# Patient Record
Sex: Male | Born: 1960 | Race: White | Hispanic: No | Marital: Single | Smoking: Current every day smoker
Health system: Southern US, Community
[De-identification: ages and names within clinical notes are randomized; demographics above are authoritative.]

## PROBLEM LIST (undated history)

## (undated) DIAGNOSIS — R51 Headache: Secondary | ICD-10-CM

## (undated) DIAGNOSIS — E559 Vitamin D deficiency, unspecified: Secondary | ICD-10-CM

## (undated) DIAGNOSIS — T754XXA Electrocution, initial encounter: Secondary | ICD-10-CM

## (undated) DIAGNOSIS — M5030 Other cervical disc degeneration, unspecified cervical region: Secondary | ICD-10-CM

## (undated) DIAGNOSIS — R197 Diarrhea, unspecified: Secondary | ICD-10-CM

## (undated) DIAGNOSIS — I639 Cerebral infarction, unspecified: Secondary | ICD-10-CM

## (undated) DIAGNOSIS — M1990 Unspecified osteoarthritis, unspecified site: Secondary | ICD-10-CM

## (undated) DIAGNOSIS — K759 Inflammatory liver disease, unspecified: Secondary | ICD-10-CM

## (undated) HISTORY — PX: KNEE SURGERY: SHX244

## (undated) HISTORY — DX: Electrocution, initial encounter: T75.4XXA

## (undated) HISTORY — DX: Unspecified osteoarthritis, unspecified site: M19.90

## (undated) HISTORY — DX: Cerebral infarction, unspecified: I63.9

## (undated) HISTORY — DX: Inflammatory liver disease, unspecified: K75.9

## (undated) HISTORY — DX: Vitamin D deficiency, unspecified: E55.9

## (undated) HISTORY — DX: Headache: R51

## (undated) HISTORY — DX: Diarrhea, unspecified: R19.7

## (undated) HISTORY — DX: Other cervical disc degeneration, unspecified cervical region: M50.30

---

## 1994-11-03 DIAGNOSIS — T754XXA Electrocution, initial encounter: Secondary | ICD-10-CM

## 1994-11-03 HISTORY — DX: Electrocution, initial encounter: T75.4XXA

## 1995-11-04 DIAGNOSIS — I639 Cerebral infarction, unspecified: Secondary | ICD-10-CM

## 1995-11-04 HISTORY — DX: Cerebral infarction, unspecified: I63.9

## 2010-03-11 HISTORY — PX: ESOPHAGOGASTRODUODENOSCOPY: SHX1529

## 2010-03-11 HISTORY — PX: COLONOSCOPY: SHX174

## 2010-05-20 ENCOUNTER — Ambulatory Visit (HOSPITAL_COMMUNITY): Admission: RE | Admit: 2010-05-20 | Discharge: 2010-05-20 | Payer: Self-pay

## 2010-05-20 HISTORY — PX: LIVER BIOPSY: SHX301

## 2010-11-19 ENCOUNTER — Ambulatory Visit
Admission: RE | Admit: 2010-11-19 | Discharge: 2010-11-19 | Payer: Self-pay | Source: Home / Self Care | Attending: Internal Medicine | Admitting: Internal Medicine

## 2010-11-24 ENCOUNTER — Encounter (INDEPENDENT_AMBULATORY_CARE_PROVIDER_SITE_OTHER): Payer: Self-pay | Admitting: Internal Medicine

## 2011-01-18 LAB — CBC
MCH: 33.9 pg (ref 26.0–34.0)
MCHC: 35.2 g/dL (ref 30.0–36.0)
MCV: 96.3 fL (ref 78.0–100.0)
Platelets: 174 10*3/uL (ref 150–400)
RBC: 4.65 MIL/uL (ref 4.22–5.81)
RDW: 13.5 % (ref 11.5–15.5)

## 2011-01-18 LAB — PROTIME-INR: Prothrombin Time: 12.5 seconds (ref 11.6–15.2)

## 2011-03-24 ENCOUNTER — Ambulatory Visit (INDEPENDENT_AMBULATORY_CARE_PROVIDER_SITE_OTHER): Payer: BC Managed Care – PPO | Admitting: Internal Medicine

## 2011-03-24 DIAGNOSIS — B182 Chronic viral hepatitis C: Secondary | ICD-10-CM

## 2011-03-24 DIAGNOSIS — R51 Headache: Secondary | ICD-10-CM

## 2011-04-26 LAB — CBC AND DIFFERENTIAL: Hemoglobin: 14.1 g/dL (ref 13.5–17.5)

## 2011-04-26 LAB — BASIC METABOLIC PANEL: Glucose: 85 mg/dL

## 2011-05-20 ENCOUNTER — Ambulatory Visit (INDEPENDENT_AMBULATORY_CARE_PROVIDER_SITE_OTHER): Payer: BC Managed Care – PPO | Admitting: Internal Medicine

## 2011-05-20 DIAGNOSIS — B182 Chronic viral hepatitis C: Secondary | ICD-10-CM

## 2011-05-28 ENCOUNTER — Other Ambulatory Visit (INDEPENDENT_AMBULATORY_CARE_PROVIDER_SITE_OTHER): Payer: Self-pay | Admitting: *Deleted

## 2011-05-28 MED ORDER — HYDROCODONE-ACETAMINOPHEN 5-500 MG PO TABS: ORAL_TABLET | ORAL | Status: DC

## 2011-05-29 NOTE — Telephone Encounter (Signed)
Terri Setzer,NP called this RX into the patient's pharmacy.

## 2011-06-09 ENCOUNTER — Encounter (INDEPENDENT_AMBULATORY_CARE_PROVIDER_SITE_OTHER): Payer: Self-pay

## 2011-07-08 ENCOUNTER — Ambulatory Visit (INDEPENDENT_AMBULATORY_CARE_PROVIDER_SITE_OTHER): Payer: BC Managed Care – PPO | Admitting: Internal Medicine

## 2011-07-09 ENCOUNTER — Telehealth (INDEPENDENT_AMBULATORY_CARE_PROVIDER_SITE_OTHER): Payer: Self-pay | Admitting: *Deleted

## 2011-07-09 ENCOUNTER — Other Ambulatory Visit (INDEPENDENT_AMBULATORY_CARE_PROVIDER_SITE_OTHER): Payer: Self-pay | Admitting: Internal Medicine

## 2011-07-09 DIAGNOSIS — D649 Anemia, unspecified: Secondary | ICD-10-CM

## 2011-07-09 DIAGNOSIS — B1920 Unspecified viral hepatitis C without hepatic coma: Secondary | ICD-10-CM

## 2011-07-09 NOTE — Telephone Encounter (Signed)
Letter sent to the patient as a reminder and the lab order was faxed to [facility]

## 2011-07-30 LAB — CBC WITH DIFFERENTIAL/PLATELET
Basophils Relative: 0 % (ref 0–1)
HCT: 51 % (ref 39.0–52.0)
Hemoglobin: 16.7 g/dL (ref 13.0–17.0)
Lymphocytes Relative: 15 % (ref 12–46)
MCHC: 32.7 g/dL (ref 30.0–36.0)
Neutro Abs: 6.1 10*3/uL (ref 1.7–7.7)
Neutrophils Relative %: 74 % (ref 43–77)
Platelets: 182 10*3/uL (ref 150–400)

## 2011-07-31 ENCOUNTER — Other Ambulatory Visit (INDEPENDENT_AMBULATORY_CARE_PROVIDER_SITE_OTHER): Payer: Self-pay | Admitting: Internal Medicine

## 2011-08-04 ENCOUNTER — Ambulatory Visit (INDEPENDENT_AMBULATORY_CARE_PROVIDER_SITE_OTHER): Payer: Self-pay | Admitting: Internal Medicine

## 2011-08-04 ENCOUNTER — Ambulatory Visit (INDEPENDENT_AMBULATORY_CARE_PROVIDER_SITE_OTHER): Payer: BC Managed Care – PPO | Admitting: Internal Medicine

## 2011-08-04 ENCOUNTER — Encounter (INDEPENDENT_AMBULATORY_CARE_PROVIDER_SITE_OTHER): Payer: Self-pay | Admitting: Internal Medicine

## 2011-08-04 VITALS — BP 120/80 | HR 74 | Temp 98.4°F | Resp 12 | Ht 69.0 in | Wt 178.0 lb

## 2011-08-04 DIAGNOSIS — B182 Chronic viral hepatitis C: Secondary | ICD-10-CM

## 2011-08-04 LAB — HEPATITIS C RNA QUANTITATIVE: HCV Quantitative: 826000 IU/mL — ABNORMAL HIGH (ref <43)

## 2011-08-04 NOTE — Patient Instructions (Signed)
Can return to work 

## 2011-08-04 NOTE — Progress Notes (Signed)
Presenting complaint; here for followup for hepatitis C. Subjective; Cody Collins 50 year old Caucasian male who has chronic hepatitis C genotype IIIa was begun on antiviral therapy on 01/17/2011. He responded to therapy his HCVRNA was undetectable in June 2012. Because of side effects he was placed on medical leave. He missed his office visit on 07/08/2011. He stopped his antiviral therapy over 4 weeks ago. He did call Ms. Todd in our office twice in this call was returned; he did not answer the phone; patient did not leave for message on her answering service that he was stopping treatment. He states he developed the URI and he was just not feeling well. He did talk with Dr. Dimas Aguas last week and he is taking Claritin and feeling better he is not having fever chills or productive cough. Current medications; Current Outpatient Prescriptions on File Prior to Visit  Medication Sig Dispense Refill  . acetaminophen (TYLENOL) 100 MG/ML solution Take by mouth every 4 (four) hours as needed.        . Albuterol Sulfate (PROAIR HFA IN) Inhale into the lungs.        Marland Kitchen HYDROcodone-acetaminophen (VICODIN) 5-500 MG per tablet Take 1 tablet by mouth twice daily as needed   60 tablet  0  . pantoprazole (PROTONIX) 40 MG tablet Take 40 mg by mouth daily.        . sertraline (ZOLOFT) 50 MG tablet Take 50 mg by mouth daily.         objective BP 120/80  Pulse 74  Temp(Src) 98.4 F (36.9 C) (Oral)  Resp 12  Ht 5\' 9"  (1.753 m)  Wt 178 lb (80.74 kg)  BMI 26.29 kg/m2 Conjunctiva is pink. Sclerae nonicteric.  Oropharyngeal mucosa is normal No neck masses or thyromegaly noted. Lungs are clear to auscultation. His abdomen is full but soft and nontender without hepatosplenomegaly or masses. He does not have LE edema. Lab data From 07/29/2011 TSH 1.381 WBC 8.3, hemoglobin 16.7, hematocrit 51.0, platelet count 182K HCVRNA is 490000 iu/ml. Assessment; Chronic hepatitis genotype IIIa; biopsy July 2011 revealed grade 2  activity or inflammation and stage 0 fibrosis; patient was treated with Pegasys and ribavirin he was HCVRNA negative at 12 weeks. He unfortunately stopped therapy on his own without our knowledge. I actually did not know until today. This this would explain why his HCV RNA is positive again. Patient did not have any fibrosis on his last liver biopsy and there for his long-term prognosis is very good she should refrain from drinking alcohol. Plan Office visit in one year. Patient was given note to return to work. Please note I went over results with Dr. Selinda Flavin who is patient's primary care physician

## 2011-08-07 ENCOUNTER — Ambulatory Visit (INDEPENDENT_AMBULATORY_CARE_PROVIDER_SITE_OTHER): Payer: BC Managed Care – PPO | Admitting: Internal Medicine

## 2011-10-15 ENCOUNTER — Encounter (INDEPENDENT_AMBULATORY_CARE_PROVIDER_SITE_OTHER): Payer: Self-pay | Admitting: *Deleted

## 2011-11-10 ENCOUNTER — Ambulatory Visit (INDEPENDENT_AMBULATORY_CARE_PROVIDER_SITE_OTHER): Payer: BC Managed Care – PPO | Admitting: Internal Medicine

## 2012-01-01 ENCOUNTER — Encounter (INDEPENDENT_AMBULATORY_CARE_PROVIDER_SITE_OTHER): Payer: Self-pay | Admitting: *Deleted

## 2012-02-02 ENCOUNTER — Encounter (INDEPENDENT_AMBULATORY_CARE_PROVIDER_SITE_OTHER): Payer: BC Managed Care – PPO | Admitting: Internal Medicine

## 2012-08-12 ENCOUNTER — Encounter (INDEPENDENT_AMBULATORY_CARE_PROVIDER_SITE_OTHER): Payer: Self-pay | Admitting: *Deleted

## 2012-08-26 ENCOUNTER — Ambulatory Visit (INDEPENDENT_AMBULATORY_CARE_PROVIDER_SITE_OTHER): Payer: BC Managed Care – PPO | Admitting: Internal Medicine

## 2015-07-19 ENCOUNTER — Encounter (INDEPENDENT_AMBULATORY_CARE_PROVIDER_SITE_OTHER): Payer: BLUE CROSS/BLUE SHIELD | Admitting: Diagnostic Neuroimaging

## 2015-07-19 ENCOUNTER — Ambulatory Visit (INDEPENDENT_AMBULATORY_CARE_PROVIDER_SITE_OTHER): Payer: BLUE CROSS/BLUE SHIELD | Admitting: Diagnostic Neuroimaging

## 2015-07-19 DIAGNOSIS — M79606 Pain in leg, unspecified: Secondary | ICD-10-CM | POA: Diagnosis not present

## 2015-07-19 DIAGNOSIS — Z0289 Encounter for other administrative examinations: Secondary | ICD-10-CM

## 2015-07-19 DIAGNOSIS — R200 Anesthesia of skin: Secondary | ICD-10-CM

## 2015-07-19 NOTE — Procedures (Signed)
   [facility]  NCS (NERVE CONDUCTION STUDY) WITH EMG (ELECTROMYOGRAPHY) REPORT   STUDY DATE: 07/19/15 PATIENT NAME: Cody Collins DOB: [birth year] MRN: [id number]  ORDERING CLINICIAN: Syliva Overman   TECHNOLOGIST: Gearldine Shown  ELECTROMYOGRAPHER: Glenford Bayley. Penumalli, MD  CLINICAL INFORMATION: 54 year old male with lower extremity numbness and pain. History of hepatitis C and chronic alcohol abuse.   FINDINGS: NERVE CONDUCTION STUDY: Bilateral peroneal and tibial motor responses are normal. Bilateral sural and peroneal sensory responses are normal.  NEEDLE ELECTROMYOGRAPHY: Needle examination of right lower extremity vastus medialis, tibials anterior, gastrocnemius is normal. Needle examination of bilateral L5-S1 paraspinal muscles are normal.   IMPRESSION:  This is a normal study. No diagnostic evidence of large fiber neuropathy or lumbar radiculopathy at this time. Given the clinical context, a small fiber neuropathy due to hepatitis C and chronic alcohol abuse may be considered. Small fiber neuropathies are not usually detected with routine nerve conduction studies.    INTERPRETING PHYSICIAN:  Suanne Marker, MD Certified in Neurology, Neurophysiology and Neuroimaging  [facility] [address], [address] [place], [place] [address] [phone]

## 2015-08-04 HISTORY — PX: CERVICAL DISC SURGERY: SHX588

## 2016-02-27 ENCOUNTER — Telehealth (HOSPITAL_COMMUNITY): Payer: Self-pay | Admitting: *Deleted

## 2016-04-14 ENCOUNTER — Telehealth (HOSPITAL_COMMUNITY): Payer: Self-pay | Admitting: *Deleted

## 2016-04-14 NOTE — Telephone Encounter (Signed)
returned phone call, no answer, left voice message. 

## 2016-04-15 ENCOUNTER — Encounter (HOSPITAL_COMMUNITY): Payer: Self-pay | Admitting: Psychiatry

## 2016-04-15 ENCOUNTER — Ambulatory Visit (INDEPENDENT_AMBULATORY_CARE_PROVIDER_SITE_OTHER): Payer: Medicaid Other | Admitting: Psychiatry

## 2016-04-15 VITALS — BP 127/92 | HR 96 | Ht 67.0 in | Wt 175.6 lb

## 2016-04-15 DIAGNOSIS — F332 Major depressive disorder, recurrent severe without psychotic features: Secondary | ICD-10-CM

## 2016-04-15 DIAGNOSIS — F329 Major depressive disorder, single episode, unspecified: Secondary | ICD-10-CM | POA: Insufficient documentation

## 2016-04-15 MED ORDER — TRAZODONE HCL 50 MG PO TABS: 50.0000 mg | ORAL_TABLET | Freq: Every day | ORAL | Status: DC

## 2016-04-15 MED ORDER — DULOXETINE HCL 60 MG PO CPEP: 60.0000 mg | ORAL_CAPSULE | Freq: Every day | ORAL | Status: DC

## 2016-04-15 NOTE — Progress Notes (Signed)
Psychiatric Initial Adult Assessment   Patient Identification: Cody Collins MRN:  [id number] Date of Evaluation:  04/15/2016 Referral Source: Dr. Dimas Aguas, [facility] Chief Complaint:   Chief Complaint    Depression; Anxiety; Establish Care     Visit Diagnosis:    ICD-9-CM ICD-10-CM   1. Severe episode of recurrent major depressive disorder, without psychotic features (HCC) 296.33 F33.2     History of Present Illness:  This patient is a 55 year old divorced white male who lives with his daughter her husband and their 56 children in [place]. 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  The patient was referred by Dr. Dimas Aguas for further treatment and assessment of depression and anxiety.  The patient states that he has had no previous mental health treatment per se. He is been depressed on and off for years. Much of this started a few years ago when he began treatment for hepatitis C and interferon made him feel sad and depressed. He's been primarily on Zoloft. Lately however he feels like his life is falling apart because of health issues. He states that he is always been "strong and able to work.". 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 He's had to quit work over the last year due to severe arthritis in his knees and legs. Last year he also had to have cervical disc surgery which is now failed and he is having severe numbness and pain down his left leg. His primary care physician tried to start him on Cymbalta but at the time he had no insurance and no way to pay for. He has run out of funds and has to stay with his daughter which is bothering him as well.  The patient has a long history of substance abuse. 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 He used it on and off for a number of years but stopped after he had his children. He has twins that are 5 and a daughter that is 61. His wife was using far more  than he was and he ended up gaining custody of the children when they were very young and he raised them. He had an affair with her babysitter later and she became pregnant and had a boy which she did not find out about until the child was 9 years old.  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 Unfortunately after that he began using methamphetamine and marijuana and alcohol again. He states that he was drinking fairly heavily until about 2 weeks ago but now he is down to 56 beers a day from 5 or 6 a day. He is no longer using any drugs.  Currently the patient states that his mood is somewhat depressed despite being on Zoloft 100 mg daily. He feels sad and overwhelmed. He can't sleep often due to pain. His energy is poor and he is irritable and grumpy. He is often nervous and anxious but denies panic attacks. He has been avoiding people. He's never been suicidal or homicidal. Sometimes he has obsessional repetitive thoughts. He laughs inappropriately all the time he states he can help himself as as it is his way of relieving stress  Associated Signs/Symptoms: Depression Symptoms:  depressed mood, anhedonia, insomnia, fatigue, feelings of worthlessness/guilt, anxiety, loss of energy/fatigue, disturbed sleep, (Hypo) Manic Symptoms:  Irritable Mood,  Anxiety Symptoms:  Excessive Worry,   Past Psychiatric History: None, other than treatment by primary care  Previous Psychotropic Medications: Yes   Substance Abuse History in the last 12 months:  Yes.    Consequences of Substance Abuse: Medical Consequences:  History of hepatitis C Legal Consequences:  History of one DUI  Past Medical History:  Past Medical History  Diagnosis Date  . Hepatitis HEP C  . Headache(784.0)   . Diarrhea   . Arthritis   . Degenerative disc disease, cervical   . Vitamin D deficiency   . Stroke  (cerebrum) [facility])     Past Surgical History  Procedure Laterality Date  . Liver biopsy  05/20/2010  . Esophagogastroduodenoscopy  03/11/2010  . Colonoscopy  03/11/2010  . Knee surgery    . Cervical disc surgery      Family Psychiatric History: The patient's father was a heavy drug user as well. The mother has a history of depression  Family History:  Family History  Problem Relation Age of Onset  . Ovarian cancer Sister   . Healthy Paternal Grandfather   . Ovarian cancer Sister   . Healthy Sister   . Pneumonia Brother   . Healthy Daughter   . Depression Mother   . Drug abuse Father     Social History:   Social History   Social History  . Marital Status: Single    Spouse Name: N/A  . Number of Children: N/A  . Years of Education: N/A   Social History Main Topics  . Smoking status: Current Every Day Smoker -- 1.00 packs/day    Types: Cigarettes  . Smokeless tobacco: Current User     Comment: Vapor  . Alcohol Use: Yes     Comment: 04-15-2016 per pt 2-3 beer per day  . Drug Use: No     Comment: 04-15-2016 per pt no. Per pt it's been years since using Meth   . Sexual Activity: Not Currently   Other Topics Concern  . None   Social History Narrative    Additional Social History: The patient grew up in [place] with his mother and 5 siblings. His father really was not part of the family. He never finished high school but left for [place] with his girlfriend at age 55. As noted above he used drugs heavily for a number of years. 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 He has been married once and divorced when his children were very young. He had another child with a babysitter whom he found out about years later. He is currently unemployed and applying for disability  Allergies:   Allergies  Allergen Reactions  . Ciprofloxacin Hcl     Per pt Throat Swells   . Codeine   . Vitamin C [Ascorbic Acid]     Patient states that this causes problems with his ears.     Metabolic Disorder Labs: No results found for: HGBA1C, MPG No results found for: PROLACTIN No results found for: CHOL, TRIG, HDL, CHOLHDL, VLDL, LDLCALC   Current Medications: Current Outpatient Prescriptions  Medication Sig Dispense Refill  . Albuterol Sulfate (PROAIR HFA IN) Inhale into the lungs.      . fluticasone (FLONASE) 50 MCG/ACT nasal spray INSTILL 2 SPRAYS IN NOSTRIL(S) EVERY MORNING  12  . gabapentin (NEURONTIN) 400 MG capsule Take 400 mg by mouth at bedtime.  5  . HYDROcodone-acetaminophen (VICODIN) 5-500 MG per tablet Take 1 tablet by mouth twice daily as needed  60 tablet 0  .  PROAIR HFA 108 (90 Base) MCG/ACT inhaler INHALE 2 PUFFS INTO LUNGS FOUR TIMES DAILY  12  . traMADol (ULTRAM) 50 MG tablet TAKE 1 TABLET AS NEEDED EVERY 6 HOURS  0  . acetaminophen (TYLENOL) 100 MG/ML solution Take by mouth every 4 (four) hours as needed. Reported on 04/15/2016    . DULoxetine (CYMBALTA) 60 MG capsule Take 1 capsule (60 mg total) by mouth daily. 30 capsule 2  . loratadine (CLARITIN) 10 MG tablet Take 10 mg by mouth daily. Reported on 04/15/2016    . pantoprazole (PROTONIX) 40 MG tablet Take 40 mg by mouth daily. Reported on 04/15/2016    . predniSONE (DELTASONE) 20 MG tablet Take 20 mg by mouth 2 (two) times daily. Reported on 04/15/2016  0  . traZODone (DESYREL) 50 MG tablet Take 1 tablet (50 mg total) by mouth at bedtime. 30 tablet 2   No current facility-administered medications for this visit.    Neurologic: Headache: Yes Seizure: No Paresthesias:Yes  Musculoskeletal: Strength & Muscle Tone: decreased Gait & Station: unsteady Patient leans: N/A  Psychiatric Specialty Exam: Review of Systems  Constitutional: Positive for malaise/fatigue.  HENT: Positive for congestion.   Genitourinary: Positive for frequency.  Musculoskeletal: Positive for back pain, joint pain and neck pain.  Neurological: Positive for tingling, sensory change and focal weakness.   Psychiatric/Behavioral: Positive for depression. The patient is nervous/anxious and has insomnia.     Blood pressure 127/92, pulse 96, height 5\' 7"  (1.702 m), weight 175 lb 9.6 oz (79.652 kg), SpO2 96 %.Body mass index is 27.5 kg/(m^2).  General Appearance: Casual and Fairly Groomed  Eye Contact:  Good  Speech:  Clear and Coherent  Volume:  Normal  Mood:  Anxious  Affect:  Inappropriate laughs inappropriately and  repeatedly   Thought Process:  Coherent  Orientation:  Full (Time, Place, and Person)  Thought Content:  Rumination  Suicidal Thoughts:  No  Homicidal Thoughts:  No  Memory:  Immediate;   Good Recent;   Fair Remote;   Fair  Judgement:  Fair  Insight:  Fair  Psychomotor Activity:  Restlessness  Concentration:  Concentration: Fair and Attention Span: Fair  Recall:  [employer]ervFair  Fund of Knowledge:Fair  Language: Good  Akathisia:  No  Handed:  Right  AIMS (if indicated):    Assets:  Communication Skills Desire for Improvement Resilience Social Support  ADL's:  Intact  Cognition: WNL  Sleep:  poor    Treatment Plan Summary: Medication management   This patient is a 55 year old white male with a long history of substance abuse. He states he is depressed because of his decline in physical function and loss of funds. Cymbalta would probably be a better choice given his chronic pain and depression. Therefore he will stop Zoloft and start Cymbalta 60 mg daily. Since he is not sleeping trazodone 2 mg at bedtime will be added. He will also start counseling here. He's been strongly advised to stop drinking. He'll return to see me in 4 weeks   Diannia RuderOSS, Lataisha Colan, MD 6/13/201711:40 AM

## 2016-04-24 ENCOUNTER — Telehealth (HOSPITAL_COMMUNITY): Payer: Self-pay | Admitting: *Deleted

## 2016-04-24 NOTE — Telephone Encounter (Signed)
left voice message, provider out of office 05/20/16.

## 2016-04-28 ENCOUNTER — Telehealth (HOSPITAL_COMMUNITY): Payer: Self-pay | Admitting: *Deleted

## 2016-04-28 NOTE — Telephone Encounter (Signed)
left voice message provider out of office 05/20/16.

## 2016-05-20 ENCOUNTER — Ambulatory Visit (HOSPITAL_COMMUNITY): Payer: Self-pay | Admitting: Psychology

## 2016-05-21 ENCOUNTER — Ambulatory Visit (INDEPENDENT_AMBULATORY_CARE_PROVIDER_SITE_OTHER): Payer: Medicaid Other | Admitting: Psychiatry

## 2016-05-21 ENCOUNTER — Encounter (HOSPITAL_COMMUNITY): Payer: Self-pay | Admitting: Psychiatry

## 2016-05-21 VITALS — BP 130/80 | Ht 69.0 in | Wt 178.0 lb

## 2016-05-21 DIAGNOSIS — F332 Major depressive disorder, recurrent severe without psychotic features: Secondary | ICD-10-CM | POA: Diagnosis not present

## 2016-05-21 MED ORDER — DULOXETINE HCL 60 MG PO CPEP: 60.0000 mg | ORAL_CAPSULE | Freq: Every day | ORAL | Status: DC

## 2016-05-21 MED ORDER — TRAZODONE HCL 50 MG PO TABS: 50.0000 mg | ORAL_TABLET | Freq: Every day | ORAL | Status: DC

## 2016-05-21 NOTE — Progress Notes (Signed)
Psychiatric Initial Adult Assessment   Patient Identification: Cody Collins MRN:  [id number] Date of Evaluation:  05/21/2016 Referral Source: Dr. Dimas Aguas, [facility] Chief Complaint:   Chief Complaint    Depression; Anxiety; Alcohol Problem; Follow-up     Visit Diagnosis:    ICD-9-CM ICD-10-CM   1. Severe episode of recurrent major depressive disorder, without psychotic features (HCC) 296.33 F33.2     History of Present Illness:  This patient is a 55 year old divorced white male who lives with his daughter her husband and their 2 children in [place]. 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  The patient was referred by Dr. Dimas Aguas for further treatment and assessment of depression and anxiety.  The patient states that he has had no previous mental health treatment per se. He is been depressed on and off for years. Much of this started a few years ago when he began treatment for hepatitis C and interferon made him feel sad and depressed. He's been primarily on Zoloft. Lately however he feels like his life is falling apart because of health issues. He states that he is always been "strong and able to work.". 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 He's had to quit work over the last year due to severe arthritis in his knees and legs. Last year he also had to have cervical disc surgery which is now failed and he is having severe numbness and pain down his left leg. His primary care physician tried to start him on Cymbalta but at the time he had no insurance and no way to pay for. He has run out of funds and has to stay with his daughter which is bothering him as well.  The patient has a long history of substance abuse. 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 He used it on and off for a number of years but stopped after he had his children. He has twins that are 83 and a daughter that is 81. His wife was  using far more than he was and he ended up gaining custody of the children when they were very young and he raised them. He had an affair with her babysitter later and she became pregnant and had a boy which she did not find out about until the child was 1 years old.  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 Unfortunately after that he began using methamphetamine and marijuana and alcohol again. He states that he was drinking fairly heavily until about 2 weeks ago but now he is down to 2-3 beers a day from 5 or 6 a day. He is no longer using any drugs.  Currently the patient states that his mood is somewhat depressed despite being on Zoloft 100 mg daily. He feels sad and overwhelmed. He can't sleep often due to pain. His energy is poor and he is irritable and grumpy. He is often nervous and anxious but denies panic attacks. He has been avoiding people. He's never been suicidal or homicidal. Sometimes he has obsessional repetitive thoughts. He laughs inappropriately all the time he states he can help himself as as it is his way of relieving stress  The patient returns after 4 weeks. He is now on Cymbalta 60 mg daily and trazodone 50 mg at bedtime his mood has improved.  He still laughs compulsively and also sometimes will reduce things over and over if he is having bad thoughts while he is doing them. He still waiting on his appointment with Dr. Shelva Majestic. He's cut his drinking way back and states he is down to about 40 ounces of beer a day and I think this is still too much. He's having conflicts with his son-in-law and the son-in-law drinks heavily as well. Overall however he thinks he is doing better and is in less pain. He's going to work hard on trying to cut down his drinking  Associated Signs/Symptoms: Depression Symptoms:  depressed mood, anhedonia, insomnia, fatigue, feelings of  worthlessness/guilt, anxiety, loss of energy/fatigue, disturbed sleep, (Hypo) Manic Symptoms:  Irritable Mood, Anxiety Symptoms:  Excessive Worry,   Past Psychiatric History: None, other than treatment by primary care  Previous Psychotropic Medications: Yes   Substance Abuse History in the last 12 months:  Yes.    Consequences of Substance Abuse: Medical Consequences:  History of hepatitis C Legal Consequences:  History of one DUI  Past Medical History:  Past Medical History  Diagnosis Date  . Hepatitis HEP C  . Headache(784.0)   . Diarrhea   . Arthritis   . Degenerative disc disease, cervical   . Vitamin D deficiency   . Stroke (cerebrum) [facility])     Past Surgical History  Procedure Laterality Date  . Liver biopsy  05/20/2010  . Esophagogastroduodenoscopy  03/11/2010  . Colonoscopy  03/11/2010  . Knee surgery    . Cervical disc surgery      Family Psychiatric History: The patient's father was a heavy drug user as well. The mother has a history of depression  Family History:  Family History  Problem Relation Age of Onset  . Ovarian cancer Sister   . Healthy Paternal Grandfather   . Ovarian cancer Sister   . Healthy Sister   . Pneumonia Brother   . Healthy Daughter   . Depression Mother   . Drug abuse Father     Social History:   Social History   Social History  . Marital Status: Single    Spouse Name: N/A  . Number of Children: N/A  . Years of Education: N/A   Social History Main Topics  . Smoking status: Current Every Day Smoker -- 1.00 packs/day    Types: Cigarettes  . Smokeless tobacco: Current User     Comment: Vapor  . Alcohol Use: Yes     Comment: 04-15-2016 per pt 2-3 beer per day  . Drug Use: No     Comment: 04-15-2016 per pt no. Per pt it's been years since using Meth   . Sexual Activity: Not Currently   Other Topics Concern  . None   Social History Narrative    Additional Social History: The patient grew up in [place] with his  mother and 5 siblings. His father really was not part of the family. He never finished high school but left for [place] with his girlfriend at age 10. As noted above he used drugs heavily for a number of years. 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 He has been married once and divorced when his children were very young. He had another child with a babysitter whom he found out about years later. He is currently unemployed and applying for disability  Allergies:   Allergies  Allergen Reactions  . Ciprofloxacin Hcl     Per pt Throat Swells   . Codeine   . Vitamin  C [Ascorbic Acid]     Patient states that this causes problems with his ears.    Metabolic Disorder Labs: No results found for: HGBA1C, MPG No results found for: PROLACTIN No results found for: CHOL, TRIG, HDL, CHOLHDL, VLDL, LDLCALC   Current Medications: Current Outpatient Prescriptions  Medication Sig Dispense Refill  . acetaminophen (TYLENOL) 100 MG/ML solution Take by mouth every 4 (four) hours as needed. Reported on 04/15/2016    . Albuterol Sulfate (PROAIR HFA IN) Inhale into the lungs.      . DULoxetine (CYMBALTA) 60 MG capsule Take 1 capsule (60 mg total) by mouth daily. 30 capsule 2  . fluticasone (FLONASE) 50 MCG/ACT nasal spray INSTILL 2 SPRAYS IN NOSTRIL(S) EVERY MORNING  12  . gabapentin (NEURONTIN) 400 MG capsule Take 400 mg by mouth at bedtime.  5  . HYDROcodone-acetaminophen (VICODIN) 5-500 MG per tablet Take 1 tablet by mouth twice daily as needed  60 tablet 0  . loratadine (CLARITIN) 10 MG tablet Take 10 mg by mouth daily. Reported on 04/15/2016    . pantoprazole (PROTONIX) 40 MG tablet Take 40 mg by mouth daily. Reported on 04/15/2016    . predniSONE (DELTASONE) 20 MG tablet Take 20 mg by mouth 2 (two) times daily. Reported on 04/15/2016  0  . PROAIR HFA 108 (90 Base) MCG/ACT inhaler INHALE 2 PUFFS INTO LUNGS FOUR TIMES DAILY  12  . traMADol (ULTRAM) 50 MG tablet TAKE 1 TABLET AS NEEDED EVERY 6 HOURS  0   . traZODone (DESYREL) 50 MG tablet Take 1 tablet (50 mg total) by mouth at bedtime. 30 tablet 2   No current facility-administered medications for this visit.    Neurologic: Headache: Yes Seizure: No Paresthesias:Yes  Musculoskeletal: Strength & Muscle Tone: decreased Gait & Station: unsteady Patient leans: N/A  Psychiatric Specialty Exam: Review of Systems  Constitutional: Positive for malaise/fatigue.  HENT: Positive for congestion.   Genitourinary: Positive for frequency.  Musculoskeletal: Positive for back pain, joint pain and neck pain.  Neurological: Positive for tingling, sensory change and focal weakness.  Psychiatric/Behavioral: Positive for depression. The patient is nervous/anxious and has insomnia.     Blood pressure 130/80, height 5\' 9"  (1.753 m), weight 178 lb (80.74 kg).Body mass index is 26.27 kg/(m^2).  General Appearance: Casual and Fairly Groomed  Eye Contact:  Good  Speech:  Clear and Coherent  Volume:  Normal  Mood:  Anxious but less depressed   Affect:  Inappropriate laughs inappropriately and  repeatedly   Thought Process:  Coherent  Orientation:  Full (Time, Place, and Person)  Thought Content:  Rumination  Suicidal Thoughts:  No  Homicidal Thoughts:  No  Memory:  Immediate;   Good Recent;   Fair Remote;   Fair  Judgement:  Fair  Insight:  Fair  Psychomotor Activity:  Restlessness  Concentration:  Concentration: Fair and Attention Span: Fair  Recall:  [employer]  Fund of Knowledge:Fair  Language: Good  Akathisia:  No  Handed:  Right  AIMS (if indicated):    Assets:  Communication Skills Desire for Improvement Resilience Social Support  ADL's:  Intact  Cognition: WNL  Sleep:  poor    Treatment Plan Summary: Medication management   Patient will continue Cymbalta 60 mg daily and trazodone 50 mg daily at bedtime. He will keep his appointment with Sudie BaileyJohn Rodenbaugh and return to see me in 2 months   Diannia RuderOSS, DEBORAH, MD 7/19/20172:06 PM

## 2016-06-04 ENCOUNTER — Ambulatory Visit (INDEPENDENT_AMBULATORY_CARE_PROVIDER_SITE_OTHER): Payer: Medicaid Other | Admitting: Psychology

## 2016-06-20 ENCOUNTER — Ambulatory Visit (HOSPITAL_COMMUNITY): Payer: Self-pay | Admitting: Psychology

## 2016-07-22 ENCOUNTER — Encounter (HOSPITAL_COMMUNITY): Payer: Self-pay | Admitting: Psychiatry

## 2016-07-22 ENCOUNTER — Encounter: Payer: Self-pay | Admitting: *Deleted

## 2016-07-22 ENCOUNTER — Ambulatory Visit (INDEPENDENT_AMBULATORY_CARE_PROVIDER_SITE_OTHER): Payer: Medicaid Other | Admitting: Psychiatry

## 2016-07-22 ENCOUNTER — Ambulatory Visit (INDEPENDENT_AMBULATORY_CARE_PROVIDER_SITE_OTHER): Payer: Medicaid Other | Admitting: Diagnostic Neuroimaging

## 2016-07-22 VITALS — BP 107/69 | HR 100 | Ht 67.0 in | Wt 177.0 lb

## 2016-07-22 VITALS — BP 137/87 | HR 75 | Ht 67.0 in | Wt 178.0 lb

## 2016-07-22 DIAGNOSIS — F332 Major depressive disorder, recurrent severe without psychotic features: Secondary | ICD-10-CM | POA: Diagnosis not present

## 2016-07-22 DIAGNOSIS — F329 Major depressive disorder, single episode, unspecified: Secondary | ICD-10-CM

## 2016-07-22 DIAGNOSIS — G3184 Mild cognitive impairment, so stated: Secondary | ICD-10-CM | POA: Diagnosis not present

## 2016-07-22 DIAGNOSIS — F1010 Alcohol abuse, uncomplicated: Secondary | ICD-10-CM | POA: Diagnosis not present

## 2016-07-22 DIAGNOSIS — F32A Depression, unspecified: Secondary | ICD-10-CM

## 2016-07-22 DIAGNOSIS — R413 Other amnesia: Secondary | ICD-10-CM | POA: Diagnosis not present

## 2016-07-22 MED ORDER — DULOXETINE HCL 60 MG PO CPEP: 60.0000 mg | ORAL_CAPSULE | Freq: Every day | ORAL | 2 refills | Status: DC

## 2016-07-22 MED ORDER — TRAZODONE HCL 50 MG PO TABS: 50.0000 mg | ORAL_TABLET | Freq: Every day | ORAL | 2 refills | Status: DC

## 2016-07-22 NOTE — Progress Notes (Signed)
Psychiatric Initial Adult Assessment   Patient Identification: Cody CongMichael E Glacken MRN:  [id number] Date of Evaluation:  07/22/2016 Referral Source: Dr. Dimas AguasHoward, [facility] Chief Complaint:   Chief Complaint    Depression; Anxiety; Follow-up     Visit Diagnosis:  No diagnosis found.  History of Present Illness:  This patient is a 55 year old divorced white male who lives with his daughter her husband and their 2 children [place]. 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  The patient was referred by Dr. Dimas AguasHoward for further treatment and assessment of depression and anxiety.  The patient states that he has had no previous mental health treatment per se. He is been depressed on and off for years. Much of this started a few years ago when he began treatment for hepatitis C and interferon made him feel sad and depressed. He's been primarily on Zoloft. Lately however he feels like his life is falling apart because of health issues. He states that he is always been "strong and able to work.". 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 He's had to quit work over the last year due to severe arthritis in his knees and legs. Last year he also had to have cervical disc surgery which is now failed and he is having severe numbness and pain down his left leg. His primary care physician tried to start him on Cymbalta but at the time he had no insurance and no way to pay for. He has run out of funds and has to stay with his daughter which is bothering him as well.  The patient has a long history of substance abuse. 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 He used it on and off for a number of years but stopped after he had his children. He has twins that are 6632 and a daughter that is 6030. His wife was using far more than he was and he ended up gaining custody of the children when they were very young and he raised them. He had an  affair with her babysitter later and she became pregnant and had a boy which she did not find out about until the child was 56 years old.  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 Unfortunately after that he began using methamphetamine and marijuana and alcohol again. He states that he was drinking fairly heavily until about 2 weeks ago but now he is down to 2-3 beers a day from 5 or 6 a day. He is no longer using any drugs.  Currently the patient states that his mood is somewhat depressed despite being on Zoloft 100 mg daily. He feels sad and overwhelmed. He can't sleep often due to pain. His energy is poor and he is irritable and grumpy. He is often nervous and anxious but denies panic attacks. He has been avoiding people. He's never been suicidal or homicidal. Sometimes he has obsessional repetitive thoughts. He laughs inappropriately all the time he states he can help himself as as it is his way of relieving stress  The patient returns after 2 months. For the most part he is doing okay but his living situation is not working out. He is very uncomfortable around his son-in-law who he claims doesn't like him and won't speak to him. He only goes to his daughter's  house and the son-in-law isn't there and sometimes sleeps in his car at friend's houses. He went out to [place] to visit his mother and had a great time out there and is thinking of moving either their [place] with his other daughters once he gets his disability. For his most part his mood has improved and he is sleeping well with the trazodone. He has cut down the beer drinking to one or 2 beers at bedtime  Associated Signs/Symptoms: Depression Symptoms:  depressed mood, anhedonia, insomnia, fatigue, feelings of worthlessness/guilt, anxiety, loss of energy/fatigue, disturbed sleep, (Hypo) Manic Symptoms:  Irritable Mood, Anxiety  Symptoms:  Excessive Worry,   Past Psychiatric History: None, other than treatment by primary care  Previous Psychotropic Medications: Yes   Substance Abuse History in the last 12 months:  Yes.    Consequences of Substance Abuse: Medical Consequences:  History of hepatitis C Legal Consequences:  History of one DUI  Past Medical History:  Past Medical History:  Diagnosis Date  . Arthritis   . Degenerative disc disease, cervical   . Diarrhea   . Electrocution 1996   from faulty wiring at pool  . Headache(784.0)   . Hepatitis HEP C   07/22/16 went through tx 3 times, clear at this time  . Stroke (cerebrum) (HCC) 1997  . Vitamin D deficiency     Past Surgical History:  Procedure Laterality Date  . CERVICAL DISC SURGERY  08/2015   fusion, pinched nerves  . COLONOSCOPY  03/11/2010  . ESOPHAGOGASTRODUODENOSCOPY  03/11/2010  . KNEE SURGERY Bilateral    2 surgeries on L, one on R- arthroscopic   . LIVER BIOPSY  05/20/2010    Family Psychiatric History: The patient's father was a heavy drug user as well. The mother has a history of depression  Family History:  Family History  Problem Relation Age of Onset  . Ovarian cancer Sister   . Ovarian cancer Sister   . Multiple sclerosis Sister   . Healthy Sister   . Pneumonia Brother   . Down syndrome Brother   . Healthy Daughter   . Depression Mother   . Parkinson's disease Mother   . Drug abuse Father   . Healthy Paternal Grandfather     Social History:   Social History   Social History  . Marital status: Single    Spouse name: N/A  . Number of children: 4  . Years of education: 26   Occupational History  .      disabled   Social History Main Topics  . Smoking status: Current Every Day Smoker    Packs/day: 1.00    Types: Cigarettes  . Smokeless tobacco: Never Used     Comment: Vapor, 07/22/16 smoking 10-15 cigs daily  . Alcohol use Yes     Comment: 04-15-2016 per pt 2-3 beer per day, 07/22/16 1-2  beers daily   . Drug use: No     Comment: 04-15-2016 per pt no. Per pt it's been years since using Meth   . Sexual activity: Not Currently   Other Topics Concern  . None   Social History Narrative   Lives with daughter    caffeine use- coffee- 3 cups daily    Additional Social History: The patient grew up in [place] with his mother and 5 siblings. His father really was not part of the family. He never finished high school but left for [place] with his girlfriend at age 21. As noted above he used drugs heavily for  a number of years. 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 He has been married once and divorced when his children were very young. He had another child with a babysitter whom he found out about years later. He is currently unemployed and applying for disability  Allergies:   Allergies  Allergen Reactions  . Ciprofloxacin Hcl     Per pt Throat Swells   . Codeine Itching  . Vitamin C [Ascorbic Acid]     Patient states that this causes problems with his ears.    Metabolic Disorder Labs: No results found for: HGBA1C, MPG No results found for: PROLACTIN No results found for: CHOL, TRIG, HDL, CHOLHDL, VLDL, LDLCALC   Current Medications: Current Outpatient Prescriptions  Medication Sig Dispense Refill  . acetaminophen (TYLENOL) 100 MG/ML solution Take by mouth every 4 (four) hours as needed. Reported on 04/15/2016    . Albuterol Sulfate (PROAIR HFA IN) Inhale into the lungs.      . cholecalciferol (VITAMIN D) 1000 units tablet Take 1,000 Units by mouth daily.    . DULoxetine (CYMBALTA) 60 MG capsule Take 1 capsule (60 mg total) by mouth daily. 30 capsule 2  . fluticasone (FLONASE) 50 MCG/ACT nasal spray INSTILL 2 SPRAYS IN NOSTRIL(S) EVERY MORNING  12  . gabapentin (NEURONTIN) 400 MG capsule Take 400 mg by mouth at bedtime.  5  . loratadine (CLARITIN) 10 MG tablet Take 10 mg by mouth daily. Reported on 04/15/2016    . pantoprazole (PROTONIX) 40 MG tablet Take 40 mg by mouth  daily. Reported on 04/15/2016    . predniSONE (DELTASONE) 20 MG tablet Take 20 mg by mouth 2 (two) times daily. Reported on 04/15/2016  0  . PROAIR HFA 108 (90 Base) MCG/ACT inhaler INHALE 2 PUFFS INTO LUNGS FOUR TIMES DAILY  12  . traMADol (ULTRAM) 50 MG tablet TAKE 1 TABLET AS NEEDED EVERY 6 HOURS  0  . traZODone (DESYREL) 50 MG tablet Take 1 tablet (50 mg total) by mouth at bedtime. 30 tablet 2   No current facility-administered medications for this visit.     Neurologic: Headache: Yes Seizure: No Paresthesias:Yes  Musculoskeletal: Strength & Muscle Tone: decreased Gait & Station: unsteady Patient leans: N/A  Psychiatric Specialty Exam: Review of Systems  Constitutional: Positive for malaise/fatigue.  HENT: Positive for congestion.   Genitourinary: Positive for frequency.  Musculoskeletal: Positive for back pain, joint pain and neck pain.  Neurological: Positive for tingling, sensory change and focal weakness.  Psychiatric/Behavioral: Positive for depression. The patient is nervous/anxious and has insomnia.     Blood pressure 107/69, pulse 100, height 5\' 7"  (1.702 m), weight 177 lb (80.3 kg).Body mass index is 27.72 kg/m.  General Appearance: Casual and Fairly Groomed  Eye Contact:  Good  Speech:  Clear and Coherent  Volume:  Normal   Mood: Slightly anxious, denies depressive symptoms   Affect:  Inappropriate laughs inappropriately and  repeatedly   Thought Process:  Coherent  Orientation:  Full (Time, Place, and Person)  Thought Content:  Rumination  Suicidal Thoughts:  No  Homicidal Thoughts:  No  Memory:  Immediate;   Good Recent;   Fair Remote;   Fair  Judgement:  Fair  Insight:  Fair  Psychomotor Activity:  Restlessness  Concentration:  Concentration: Fair and Attention Span: Fair  Recall:  [employer] of Knowledge:Fair  Language: Good  Akathisia:  No  Handed:  Right  AIMS (if indicated):    Assets:  Communication Skills Desire for  Improvement Resilience Social Support  ADL's:  Intact  Cognition: WNL  Sleep:  poor    Treatment Plan Summary: Medication management   Patient will continue Cymbalta 60 mg daily and trazodone 50 mg daily at bedtime. He will keep his appointment with Sudie Bailey and return to see me in 3 months   Diannia Ruder, MD 9/19/20172:52 PM

## 2016-07-22 NOTE — Progress Notes (Signed)
[facility]  PATIENT: Cody Collins DOB: October 29, 1961  REFERRING CLINICIAN: Syliva Overman HISTORY FROM: patient  REASON FOR VISIT: new consult    HISTORICAL  CHIEF COMPLAINT:  Chief Complaint  Patient presents with  . Memory Loss    rm 6, New Pt , MMSE 25    HISTORY OF PRESENT ILLNESS:   55 year old male here for evaluation of memory problems. For past 8 years he reports short-term memory problems, difficult to remember appointments, forgetting conversations, having difficulty with this pin number, forgetting combination locks. Patient lives with his daughter. Patient is disabled from July 2016 due to back problems. Otherwise he is able to take care of most of his day-to-day activities.  Patient has history of stroke at age 63 years old resulting in left face weakness and numbness. He had almost complete recovery.  Patient has history of hepatitis. Has history of excessive alcohol use but now has cut down.   REVIEW OF SYSTEMS: Full 14 system review of systems performed and negative with exception of: Only as per history of present illness.  ALLERGIES: Allergies  Allergen Reactions  . Ciprofloxacin Hcl     Per pt Throat Swells   . Codeine Itching  . Vitamin C [Ascorbic Acid]     Patient states that this causes problems with his ears.    HOME MEDICATIONS: Outpatient Medications Prior to Visit  Medication Sig Dispense Refill  . acetaminophen (TYLENOL) 100 MG/ML solution Take by mouth every 4 (four) hours as needed. Reported on 04/15/2016    . Albuterol Sulfate (PROAIR HFA IN) Inhale into the lungs.      . DULoxetine (CYMBALTA) 60 MG capsule Take 1 capsule (60 mg total) by mouth daily. 30 capsule 2  . fluticasone (FLONASE) 50 MCG/ACT nasal spray INSTILL 2 SPRAYS IN NOSTRIL(S) EVERY MORNING  12  . gabapentin (NEURONTIN) 400 MG capsule Take 400 mg by mouth at bedtime.  5  . HYDROcodone-acetaminophen (VICODIN) 5-500 MG per tablet Take 1 tablet by mouth twice  daily as needed  60 tablet 0  . loratadine (CLARITIN) 10 MG tablet Take 10 mg by mouth daily. Reported on 04/15/2016    . pantoprazole (PROTONIX) 40 MG tablet Take 40 mg by mouth daily. Reported on 04/15/2016    . predniSONE (DELTASONE) 20 MG tablet Take 20 mg by mouth 2 (two) times daily. Reported on 04/15/2016  0  . PROAIR HFA 108 (90 Base) MCG/ACT inhaler INHALE 2 PUFFS INTO LUNGS FOUR TIMES DAILY  12  . traMADol (ULTRAM) 50 MG tablet TAKE 1 TABLET AS NEEDED EVERY 6 HOURS  0  . traZODone (DESYREL) 50 MG tablet Take 1 tablet (50 mg total) by mouth at bedtime. 30 tablet 2   No facility-administered medications prior to visit.     PAST MEDICAL HISTORY: Past Medical History:  Diagnosis Date  . Arthritis   . Degenerative disc disease, cervical   . Diarrhea   . Electrocution 1996   from faulty wiring at pool  . Headache(784.0)   . Hepatitis HEP C   07/22/16 went through tx 3 times, clear at this time  . Stroke (cerebrum) (HCC) 1997  . Vitamin D deficiency     PAST SURGICAL HISTORY: Past Surgical History:  Procedure Laterality Date  . CERVICAL DISC SURGERY  08/2015   fusion, pinched nerves  . COLONOSCOPY  03/11/2010  . ESOPHAGOGASTRODUODENOSCOPY  03/11/2010  . KNEE SURGERY Bilateral    2 surgeries on L, one on R- arthroscopic   .  LIVER BIOPSY  05/20/2010    FAMILY HISTORY: Family History  Problem Relation Age of Onset  . Ovarian cancer Sister   . Ovarian cancer Sister   . Multiple sclerosis Sister   . Healthy Sister   . Pneumonia Brother   . Down syndrome Brother   . Healthy Daughter   . Depression Mother   . Parkinson's disease Mother   . Drug abuse Father   . Healthy Paternal Grandfather     SOCIAL HISTORY:  Social History   Social History  . Marital status: Single    Spouse name: N/A  . Number of children: 4  . Years of education: 40   Occupational History  .      disabled   Social History Main Topics  . Smoking status: Current Every Day Smoker     Packs/day: 1.00    Types: Cigarettes  . Smokeless tobacco: Never Used     Comment: Vapor, 07/22/16 smoking 10-15 cigs daily  . Alcohol use Yes     Comment: 04-15-2016 per pt 2-3 beer per day, 07/22/16 1-2  beers daily  . Drug use: No     Comment: 04-15-2016 per pt no. Per pt it's been years since using Meth   . Sexual activity: Not Currently   Other Topics Concern  . Not on file   Social History Narrative   Lives with daughter    caffeine use- coffee- 3 cups daily     PHYSICAL EXAM  GENERAL EXAM/CONSTITUTIONAL: Vitals:  Vitals:   07/22/16 0901  BP: 137/87  Pulse: 75  Weight: 178 lb (80.7 kg)  Height: 5\' 7"  (1.702 m)     Body mass index is 27.88 kg/m.  Visual Acuity Screening   Right eye Left eye Both eyes  Without correction:     With correction: 20/40 20/40      Patient is in no distress; well developed, nourished and groomed  NECK HAS DECR RANGE OF MOTION  CARDIOVASCULAR:  Examination of carotid arteries is normal; no carotid bruits  Regular rate and rhythm, no murmurs  Examination of peripheral vascular system by observation and palpation is normal  EYES:  Ophthalmoscopic exam of optic discs and posterior segments is normal; no papilledema or hemorrhages  MUSCULOSKELETAL:  Gait, strength, tone, movements noted in Neurologic exam below  NEUROLOGIC: MENTAL STATUS:  MMSE - Mini Mental State Exam 07/22/2016  Orientation to time 5  Orientation to Place 5  Registration 3  Attention/ Calculation 3  Recall 2  Language- name 2 objects 2  Language- repeat 1  Language- follow 3 step command 2  Language- follow 3 step command-comments "You don't want me to crease it, do you?"  Language- read & follow direction 1  Write a sentence 1  Copy design 0  Total score 25    awake, alert, oriented to person, place and time  recent and remote memory intact  normal attention and concentration  language fluent, comprehension intact, naming intact,   fund of  knowledge appropriate  EXCESSIVELY JOVIAL; INAPPROPRIATE LAUGHTER  CRANIAL NERVE:   2nd - no papilledema on fundoscopic exam  2nd, 3rd, 4th, 6th - pupils equal and reactive to light, visual fields full to confrontation, extraocular muscles intact, no nystagmus  5th - facial sensation symmetric  7th - facial strength symmetric  8th - hearing intact  9th - palate elevates symmetrically, uvula midline  11th - shoulder shrug symmetric  12th - tongue protrusion midline  MOTOR:   normal bulk and  tone, full strength in the BUE, BLE  SLOW MOTOR MOVEMENTS THROUGHOUT  SENSORY:   normal and symmetric to light touch, temperature, vibration  COORDINATION:   finger-nose-finger, fine finger movements normal  REFLEXES:   deep tendon reflexes TRACE and symmetric  GAIT/STATION:   narrow based gait; SLOW CAUTIOUS ANTALGIC GAIT; USES WOOD WALKING STICK    DIAGNOSTIC DATA (LABS, IMAGING, TESTING) - I reviewed patient records, labs, notes, testing and imaging myself where available.  Lab Results  Component Value Date   WBC 8.3 07/29/2011   HGB 16.7 07/29/2011   HCT 51.0 07/29/2011   MCV 101.6 (H) 07/29/2011   PLT 182 07/29/2011      Component Value Date/Time   NA 140 04/26/2011   K 4.5 04/26/2011   No results found for: CHOL, HDL, LDLCALC, LDLDIRECT, TRIG, CHOLHDL No results found for: ZOXW9UHGBA1C No results found for: VITAMINB12 Lab Results  Component Value Date   TSH 1.381 07/29/2011    02/04/13 CT head [I reviewed images myself and agree with interpretation. -VRP]  1. Slight progression of multilevel spondylosis in the cervical spine. 2. Mild central and moderate bilateral foraminal narrowing at C3-4. 3. Similar appearance of moderate central and bilateral foraminal narrowing at C4-5. 4. Slight progression of moderate central canal narrowing at C5-6. 5. Moderate foraminal narrowing bilaterally at C5-6. 6. Mild to moderate central and moderate foraminal narrowing  bilaterally at C6-7. 7. Moderate left and mild right foraminal stenosis at C7-T1 due to asymmetric facet hypertrophy.  06/13/15 MRI cervical spine [I reviewed images myself and agree with interpretation. -VRP]  1. Slight progression of multilevel spondylosis in the cervical spine. 2. Mild central and moderate bilateral foraminal narrowing at C3-4. 3. Similar appearance of moderate central and bilateral foraminal narrowing at C4-5. 4. Slight progression of moderate central canal narrowing at C5-6. 5. Moderate foraminal narrowing bilaterally at C5-6. 6. Mild to moderate central and moderate foraminal narrowing bilaterally at C6-7. 7. Moderate left and mild right foraminal stenosis at C7-T1 due to asymmetric facet hypertrophy.  06/13/15 MRI lumbar [I reviewed images myself and agree with interpretation. -VRP]  1. Disc degeneration at L3-L4 and L4-L5. At the latter there is right foraminal disc resulting in moderate foraminal stenosis. Query right L4 radiculitis.  2. No lumbar or lower thoracic spinal stenosis. Mild to moderate lower thoracic neural foraminal stenosis due to combined disc and facet degeneration. 3. Mild increased T2 signal in the visualized lower thoracic spinal cord, favored to be inconsequential (such as benign prominence of the central spinal canal or ventriculus terminalis variant). But if there are symptoms referable to the thoracic spinal cord, recommend followup thoracic spine MRI (without and with contrast preferred).  07/19/15 EMG/NCS - This is a normal study. No diagnostic evidence of large fiber neuropathy or lumbar radiculopathy at this time. Given the clinical context, a small fiber neuropathy due to hepatitis C and chronic alcohol abuse may be considered. Small fiber neuropathies are not usually detected with routine nerve conduction studies.      ASSESSMENT AND PLAN  55 y.o. year old male here with progressive short-term memory problems, forgetting appointments,  forgetting conversations, repeating himself, forgetting pin numbers and combination locks. May represent mild cognitive impairment, mild dementia, or other problem. Will proceed with further workup.   Ddx: memory loss (due to depression, pain, insomnia, chronic alcohol abuse) vs dementia   1. Memory loss   2. MCI (mild cognitive impairment) with memory loss   3. Depression   4. Chronic alcohol abuse  PLAN: - check MRI brain and labs - continue psychiatry/psychology treatments for depression - continue to cut down etoh use  Orders Placed This Encounter  Procedures  . MR Brain Wo Contrast  . Dementia Panel   Return in about 3 months (around 10/21/2016).  I reviewed images, labs, notes, records myself. I summarized findings and reviewed with patient, for this high risk condition (memory loss) requiring high complexity decision making.    Suanne Marker, MD 07/22/2016, 9:40 AM Certified in Neurology, Neurophysiology and Neuroimaging  [facility] [address], [address] [place], [place] [address] [phone]

## 2016-07-22 NOTE — Patient Instructions (Signed)
Thank you for coming to see [place] at [facility]. I hope we have been able to provide you high quality care today.  You may receive a patient satisfaction survey over the next few weeks. We would appreciate your feedback and comments so that we may continue to improve ourselves and the health of our patients.  - I will check MRI brain and lab testing - stay active (mentally and physically) - reduce cigarette and alcohol use   ~~~~~~~~~~~~~~~~~~~~~~~~~~~~~~~~~~~~~~~~~~~~~~~~~~~~~~~~~~~~~~~~~  DR. Saladin Petrelli'S GUIDE TO HAPPY AND HEALTHY LIVING These are some of my general health and wellness recommendations. Some of them may apply to you better than others. Please use common sense as you try these suggestions and feel free to ask me any questions.   ACTIVITY/FITNESS Mental, social, emotional and physical stimulation are very important for brain and body health. Try learning a new activity (arts, music, language, sports, games).  Keep moving your body to the best of your abilities. You can do this at home, inside or outside, the park, community center, gym or anywhere you like. Consider a physical therapist or personal trainer to get started. Consider the app Sworkit. Fitness trackers such as smart-watches, smart-phones or Fitbits can help as well.   NUTRITION Eat more plants: colorful vegetables, nuts, seeds and berries.  Eat less sugar, salt, preservatives and processed foods.  Avoid toxins such as cigarettes and alcohol.  Drink water when you are thirsty. Warm water with a slice of lemon is an excellent morning drink to start the day.  Consider these websites for more information The Nutrition Source (https://www.henry-hernandez.biz/) Precision Nutrition (WindowBlog.ch)   RELAXATION Consider practicing mindfulness meditation or other relaxation techniques such as deep breathing, prayer, yoga, tai chi, massage. See website  mindful.org or the apps Headspace or Calm to help get started.   SLEEP Try to get at least 7-8+ hours sleep per day. Regular exercise and reduced caffeine will help you sleep better. Practice good sleep hygeine techniques. See website sleep.org for more information.   PLANNING Prepare estate planning, living will, healthcare POA documents. Sometimes this is best planned with the help of an attorney. Theconversationproject.org and agingwithdignity.org are excellent resources.

## 2016-07-23 LAB — DEMENTIA PANEL
Homocysteine: 12.4 umol/L (ref 0.0–15.0)
RPR: NONREACTIVE
TSH: 2.37 u[IU]/mL (ref 0.450–4.500)
Vitamin B-12: 336 pg/mL (ref 211–946)

## 2016-07-24 ENCOUNTER — Telehealth: Payer: Self-pay | Admitting: *Deleted

## 2016-07-24 NOTE — Telephone Encounter (Signed)
-----   Message from Vikram R Penumalli, MD sent at 07/24/2016  2:09 PM EDT ----- Unremarkable labs. Continue current plan. Please call patient. -VRP 

## 2016-07-24 NOTE — Telephone Encounter (Signed)
Called and LVM for pt about unremarkable results per Dr Marjory LiesPenumalli. Gave [facility] phone number if he has further questions or concerns.

## 2016-08-07 ENCOUNTER — Ambulatory Visit (INDEPENDENT_AMBULATORY_CARE_PROVIDER_SITE_OTHER): Payer: Medicaid Other | Admitting: Psychology

## 2016-08-14 ENCOUNTER — Ambulatory Visit
Admission: RE | Admit: 2016-08-14 | Discharge: 2016-08-14 | Disposition: A | Discharge status: Home / Self Care | Payer: Medicaid Other | Source: Ambulatory Visit | Attending: Diagnostic Neuroimaging | Admitting: Diagnostic Neuroimaging

## 2016-08-14 ENCOUNTER — Inpatient Hospital Stay: Admission: RE | Admit: 2016-08-14 | Payer: Self-pay | Source: Ambulatory Visit

## 2016-08-14 DIAGNOSIS — R413 Other amnesia: Secondary | ICD-10-CM

## 2016-08-14 DIAGNOSIS — G3184 Mild cognitive impairment, so stated: Secondary | ICD-10-CM

## 2016-08-14 DIAGNOSIS — F32A Depression, unspecified: Secondary | ICD-10-CM

## 2016-08-14 DIAGNOSIS — F329 Major depressive disorder, single episode, unspecified: Secondary | ICD-10-CM

## 2016-08-14 DIAGNOSIS — F1010 Alcohol abuse, uncomplicated: Secondary | ICD-10-CM

## 2016-08-18 ENCOUNTER — Telehealth: Payer: Self-pay | Admitting: *Deleted

## 2016-08-18 NOTE — Telephone Encounter (Signed)
Per Dr Marjory LiesPenumalli, LVM for patient informing him his MRI result showed no major changes from previous MRI. Dr Marjory LiesPenumalli will continue with his current treatment plan; advised Dr Marjory LiesPenumalli recommended he cut down on alcohol use and continue with psychiatry for depression. Left name, number for any questions.

## 2016-08-28 ENCOUNTER — Ambulatory Visit (INDEPENDENT_AMBULATORY_CARE_PROVIDER_SITE_OTHER): Payer: Medicaid Other | Admitting: Psychology

## 2016-09-24 ENCOUNTER — Ambulatory Visit (INDEPENDENT_AMBULATORY_CARE_PROVIDER_SITE_OTHER): Payer: Medicaid Other | Admitting: Psychology

## 2016-10-08 ENCOUNTER — Ambulatory Visit (INDEPENDENT_AMBULATORY_CARE_PROVIDER_SITE_OTHER): Payer: Medicaid Other | Admitting: Psychology

## 2016-10-08 DIAGNOSIS — F332 Major depressive disorder, recurrent severe without psychotic features: Secondary | ICD-10-CM | POA: Diagnosis not present

## 2016-10-10 ENCOUNTER — Other Ambulatory Visit (HOSPITAL_COMMUNITY): Payer: Self-pay | Admitting: Psychiatry

## 2016-10-13 ENCOUNTER — Telehealth (HOSPITAL_COMMUNITY): Payer: Self-pay | Admitting: *Deleted

## 2016-10-13 NOTE — Telephone Encounter (Signed)
Called pt number on file and no response. [email or web address]

## 2016-10-13 NOTE — Telephone Encounter (Signed)
Called pt and informed him that his pharmacy was requesting refills for his medications and per his chart he should be out of meds the day of his appt. Pt verbalized showed understanding and verified appt for 10-21-2016

## 2016-10-13 NOTE — Telephone Encounter (Signed)
patient returned phone call to [facility] at 10:12 a.m.

## 2016-10-21 ENCOUNTER — Ambulatory Visit (INDEPENDENT_AMBULATORY_CARE_PROVIDER_SITE_OTHER): Payer: Medicaid Other | Admitting: Psychiatry

## 2016-10-21 ENCOUNTER — Encounter (HOSPITAL_COMMUNITY): Payer: Self-pay | Admitting: Psychiatry

## 2016-10-21 VITALS — BP 131/92 | HR 85 | Ht 67.0 in | Wt 181.6 lb

## 2016-10-21 DIAGNOSIS — Z813 Family history of other psychoactive substance abuse and dependence: Secondary | ICD-10-CM

## 2016-10-21 DIAGNOSIS — Z888 Allergy status to other drugs, medicaments and biological substances status: Secondary | ICD-10-CM

## 2016-10-21 DIAGNOSIS — F17210 Nicotine dependence, cigarettes, uncomplicated: Secondary | ICD-10-CM

## 2016-10-21 DIAGNOSIS — Z8041 Family history of malignant neoplasm of ovary: Secondary | ICD-10-CM | POA: Diagnosis not present

## 2016-10-21 DIAGNOSIS — Z79899 Other long term (current) drug therapy: Secondary | ICD-10-CM

## 2016-10-21 DIAGNOSIS — F332 Major depressive disorder, recurrent severe without psychotic features: Secondary | ICD-10-CM

## 2016-10-21 DIAGNOSIS — Z818 Family history of other mental and behavioral disorders: Secondary | ICD-10-CM | POA: Diagnosis not present

## 2016-10-21 DIAGNOSIS — Z8489 Family history of other specified conditions: Secondary | ICD-10-CM | POA: Diagnosis not present

## 2016-10-21 MED ORDER — DULOXETINE HCL 60 MG PO CPEP: 60.0000 mg | ORAL_CAPSULE | Freq: Two times a day (BID) | ORAL | 2 refills | Status: DC

## 2016-10-21 MED ORDER — TRAZODONE HCL 50 MG PO TABS: 50.0000 mg | ORAL_TABLET | Freq: Every day | ORAL | 2 refills | Status: DC

## 2016-10-21 NOTE — Progress Notes (Signed)
Psychiatric Initial Adult Assessment   Patient Identification: Cody Collins MRN:  [id number] Date of Evaluation:  10/21/2016 Referral Source: Dr. Dimas Aguas, [facility] Chief Complaint:   Chief Complaint    Depression; Anxiety; Follow-up     Visit Diagnosis:    ICD-9-CM ICD-10-CM   1. Severe episode of recurrent major depressive disorder, without psychotic features (HCC) 296.33 F33.2     History of Present Illness:  This patient is a 55 year old divorced white male who lives with his daughter her husband and their 2 children in [place]. 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  The patient was referred by Dr. Dimas Aguas for further treatment and assessment of depression and anxiety.  The patient states that he has had no previous mental health treatment per se. He is been depressed on and off for years. Much of this started a few years ago when he began treatment for hepatitis C and interferon made him feel sad and depressed. He's been primarily on Zoloft. Lately however he feels like his life is falling apart because of health issues. He states that he is always been "strong and able to work.". 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 He's had to quit work over the last year due to severe arthritis in his knees and legs. Last year he also had to have cervical disc surgery which is now failed and he is having severe numbness and pain down his left leg. His primary care physician tried to start him on Cymbalta but at the time he had no insurance and no way to pay for. He has run out of funds and has to stay with his daughter which is bothering him as well.  The patient has a long history of substance abuse. 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 He used it on and off for a number of years but stopped after he had his children. He has twins that are 8 and a daughter that is 19. His wife was using far more  than he was and he ended up gaining custody of the children when they were very young and he raised them. He had an affair with her babysitter later and she became pregnant and had a boy which she did not find out about until the child was 72 years old.  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 Unfortunately after that he began using methamphetamine and marijuana and alcohol again. He states that he was drinking fairly heavily until about 2 weeks ago but now he is down to 2-3 beers a day from 5 or 6 a day. He is no longer using any drugs.  Currently the patient states that his mood is somewhat depressed despite being on Zoloft 100 mg daily. He feels sad and overwhelmed. He can't sleep often due to pain. His energy is poor and he is irritable and grumpy. He is often nervous and anxious but denies panic attacks. He has been avoiding people. He's never been suicidal or homicidal. Sometimes he has obsessional repetitive thoughts. He laughs inappropriately all the time he states he can help himself as as it is his way of relieving stress  The patient returns after 2 months. For the most part he is doing okay but his living situation is still difficult. He and his son-in-law are  not speaking. He states that he is been thinking a lot and he has the same thoughts over and over again. He is very worried about financial issues and probably won't have a disability hearing until next summer. He currently has no income. I suggested we increase the Cymbalta for depression and anxiety and he is in agreement. To his credit he has totally quit drinking. His sleep is okay with the trazodone  Associated Signs/Symptoms: Depression Symptoms:  depressed mood, anhedonia, insomnia, fatigue, feelings of worthlessness/guilt, anxiety, loss of energy/fatigue, disturbed sleep, (Hypo) Manic Symptoms:  Irritable  Mood, Anxiety Symptoms:  Excessive Worry,   Past Psychiatric History: None, other than treatment by primary care  Previous Psychotropic Medications: Yes   Substance Abuse History in the last 12 months:  Yes.    Consequences of Substance Abuse: Medical Consequences:  History of hepatitis C Legal Consequences:  History of one DUI  Past Medical History:  Past Medical History:  Diagnosis Date  . Arthritis   . Degenerative disc disease, cervical   . Diarrhea   . Electrocution 1996   from faulty wiring at pool  . Headache(784.0)   . Hepatitis HEP C   07/22/16 went through tx 3 times, clear at this time  . Stroke (cerebrum) (HCC) 1997  . Vitamin D deficiency     Past Surgical History:  Procedure Laterality Date  . CERVICAL DISC SURGERY  08/2015   fusion, pinched nerves  . COLONOSCOPY  03/11/2010  . ESOPHAGOGASTRODUODENOSCOPY  03/11/2010  . KNEE SURGERY Bilateral    2 surgeries on L, one on R- arthroscopic   . LIVER BIOPSY  05/20/2010    Family Psychiatric History: The patient's father was a heavy drug user as well. The mother has a history of depression  Family History:  Family History  Problem Relation Age of Onset  . Ovarian cancer Sister   . Ovarian cancer Sister   . Multiple sclerosis Sister   . Healthy Sister   . Pneumonia Brother   . Down syndrome Brother   . Healthy Daughter   . Depression Mother   . Parkinson's disease Mother   . Drug abuse Father   . Healthy Paternal Grandfather     Social History:   Social History   Social History  . Marital status: Single    Spouse name: N/A  . Number of children: 4  . Years of education: 4011   Occupational History  .      disabled   Social History Main Topics  . Smoking status: Current Every Day Smoker    Packs/day: 1.00    Types: Cigarettes  . Smokeless tobacco: Never Used     Comment: Vapor, 07/22/16 smoking 10-15 cigs daily  . Alcohol use Yes     Comment: 04-15-2016 per pt 2-3 beer per day, 07/22/16 1-2   beers daily  . Drug use: No     Comment: 04-15-2016 per pt no. Per pt it's been years since using Meth   . Sexual activity: Not Currently   Other Topics Concern  . None   Social History Narrative   Lives with daughter    caffeine use- coffee- 3 cups daily    Additional Social History: The patient grew up [place] with his mother and 5 siblings. His father really was not part of the family. He never finished high school but left for [place] with his girlfriend at age 55. As noted above he used drugs heavily for a number of years. 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 He has been married once and divorced when his children were very young. He had another child with a babysitter whom he found out about years later. He is currently unemployed and applying for disability  Allergies:   Allergies  Allergen Reactions  . Ciprofloxacin Hcl     Per pt Throat Swells   . Codeine Itching  . Vitamin C [Ascorbic Acid]     Patient states that this causes problems with his ears.    Metabolic Disorder Labs: No results found for: HGBA1C, MPG No results found for: PROLACTIN No results found for: CHOL, TRIG, HDL, CHOLHDL, VLDL, LDLCALC   Current Medications: Current Outpatient Prescriptions  Medication Sig Dispense Refill  . acetaminophen (TYLENOL) 100 MG/ML solution Take by mouth every 4 (four) hours as needed. Reported on 04/15/2016    . Albuterol Sulfate (PROAIR HFA IN) Inhale into the lungs.      . cholecalciferol (VITAMIN D) 1000 units tablet Take 1,000 Units by mouth daily.    . colchicine 0.6 MG tablet TAKE 2 TABLETS BY MOUTH ONCE THEN 1 TABLET ONE HOUR LATER MAY REPEAT NEXT DAY IF NEEDED  1  . DULoxetine (CYMBALTA) 60 MG capsule Take 1 capsule (60 mg total) by mouth 2 (two) times daily. 60 capsule 2  . fluticasone (FLONASE) 50 MCG/ACT nasal spray INSTILL 2 SPRAYS IN NOSTRIL(S) EVERY MORNING  12  . gabapentin (NEURONTIN) 400 MG capsule Take 400 mg by mouth at bedtime.  5  .  ibuprofen (ADVIL,MOTRIN) 800 MG tablet Take 800 mg by mouth every 8 (eight) hours as needed.    . loratadine (CLARITIN) 10 MG tablet Take 10 mg by mouth daily. Reported on 04/15/2016    . PROAIR HFA 108 (90 Base) MCG/ACT inhaler INHALE 2 PUFFS INTO LUNGS FOUR TIMES DAILY  12  . traMADol (ULTRAM) 50 MG tablet TAKE 1 TABLET AS NEEDED EVERY 6 HOURS  0  . traZODone (DESYREL) 50 MG tablet Take 1 tablet (50 mg total) by mouth at bedtime. 30 tablet 2  . pantoprazole (PROTONIX) 40 MG tablet Take 40 mg by mouth daily. Reported on 04/15/2016     No current facility-administered medications for this visit.     Neurologic: Headache: Yes Seizure: No Paresthesias:Yes  Musculoskeletal: Strength & Muscle Tone: decreased Gait & Station: unsteady Patient leans: N/A  Psychiatric Specialty Exam: Review of Systems  Constitutional: Positive for malaise/fatigue.  HENT: Positive for congestion.   Genitourinary: Positive for frequency.  Musculoskeletal: Positive for back pain, joint pain and neck pain.  Neurological: Positive for tingling, sensory change and focal weakness.  Psychiatric/Behavioral: Positive for depression. The patient is nervous/anxious and has insomnia.     Blood pressure (!) 131/92, pulse 85, height 5\' 7"  (1.702 m), weight 181 lb 9.6 oz (82.4 kg), SpO2 95 %.Body mass index is 28.44 kg/m.  General Appearance: Casual and Fairly Groomed  Eye Contact:  Good  Speech:  Clear and Coherent  Volume:  Normal   Mood:Anxious   Affect: Worried   Thought Process:  Coherent  Orientation:  Full (Time, Place, and Person)  Thought Content:  Rumination,Repetitive thoughts   Suicidal Thoughts:  No  Homicidal Thoughts:  No  Memory:  Immediate;   Good Recent;   Fair Remote;   Fair  Judgement:  Fair  Insight:  Fair  Psychomotor Activity:  Restlessness  Concentration:  Concentration: Fair and Attention Span: Fair  Recall:  [employer] of Knowledge:Fair  Language: Good  Akathisia:  No  Handed:   Right  AIMS (if indicated):    Assets:  Communication Skills Desire for Improvement Resilience Social Support  ADL's:  Intact  Cognition: WNL  Sleep:  poor    Treatment Plan Summary: Medication management   Patient will continue Cymbalta But increase the dose to 60 no gram twice a day and trazodone 50 mg daily at bedtime. He will keep his appointment with Sudie BaileyJohn Rodenbaugh and return to see me in 4 weeks   Diannia RuderOSS, Malya Cirillo, MD 12/19/20171:42 PM

## 2016-10-22 ENCOUNTER — Encounter: Payer: Self-pay | Admitting: Diagnostic Neuroimaging

## 2016-10-22 ENCOUNTER — Ambulatory Visit (INDEPENDENT_AMBULATORY_CARE_PROVIDER_SITE_OTHER): Payer: Medicaid Other | Admitting: Diagnostic Neuroimaging

## 2016-10-22 VITALS — BP 132/82 | HR 78 | Wt 182.6 lb

## 2016-10-22 DIAGNOSIS — G3184 Mild cognitive impairment, so stated: Secondary | ICD-10-CM | POA: Diagnosis not present

## 2016-10-22 DIAGNOSIS — M79606 Pain in leg, unspecified: Secondary | ICD-10-CM

## 2016-10-22 DIAGNOSIS — R413 Other amnesia: Secondary | ICD-10-CM | POA: Diagnosis not present

## 2016-10-22 NOTE — Progress Notes (Signed)
[facility]  PATIENT: Cody Collins DOB: Jan 02, 1961  REFERRING CLINICIAN: Syliva Overman HISTORY FROM: patient  REASON FOR VISIT:follow up   HISTORICAL  CHIEF COMPLAINT:  Chief Complaint  Patient presents with  . Memory Loss    rm 7, MMSE  25  . Follow-up    3 month    HISTORY OF PRESENT ILLNESS:   UPDATE 10/22/16: Since last visit, doing well. Still continues with some memory issues. Living with his daughter and son in law, and apparently son in law is an alcoholic, and family living situation can be stressful. Patient seeing psychiatry.   PRIOR HPI (07/22/16): 56 year old male here for evaluation of memory problems. For past 8 years he reports short-term memory problems, difficult to remember appointments, forgetting conversations, having difficulty with this pin number, forgetting combination locks. Patient lives with his daughter. Patient is disabled from July 2016 due to back problems. Otherwise he is able to take care of most of his day-to-day activities. Patient has history of stroke at age 60 years old resulting in left face weakness and numbness. He had almost complete recovery. Patient has history of hepatitis. Has history of excessive alcohol use but now has cut down.   REVIEW OF SYSTEMS: Full 14 system review of systems performed and negative with exception of: Only as per history of present illness.  ALLERGIES: Allergies  Allergen Reactions  . Ciprofloxacin Hcl     Per pt Throat Swells   . Codeine Itching  . Vitamin C [Ascorbic Acid]     Patient states that this causes problems with his ears.    HOME MEDICATIONS: Outpatient Medications Prior to Visit  Medication Sig Dispense Refill  . acetaminophen (TYLENOL) 100 MG/ML solution Take by mouth every 4 (four) hours as needed. Reported on 04/15/2016    . Albuterol Sulfate (PROAIR HFA IN) Inhale into the lungs.      . cholecalciferol (VITAMIN D) 1000 units tablet Take 1,000 Units by mouth daily.      . colchicine 0.6 MG tablet TAKE 2 TABLETS BY MOUTH ONCE THEN 1 TABLET ONE HOUR LATER MAY REPEAT NEXT DAY IF NEEDED  1  . DULoxetine (CYMBALTA) 60 MG capsule Take 1 capsule (60 mg total) by mouth 2 (two) times daily. 60 capsule 2  . fluticasone (FLONASE) 50 MCG/ACT nasal spray INSTILL 2 SPRAYS IN NOSTRIL(S) EVERY MORNING  12  . gabapentin (NEURONTIN) 400 MG capsule Take 400 mg by mouth at bedtime.  5  . ibuprofen (ADVIL,MOTRIN) 800 MG tablet Take 800 mg by mouth every 8 (eight) hours as needed.    . loratadine (CLARITIN) 10 MG tablet Take 10 mg by mouth daily. Reported on 04/15/2016    . pantoprazole (PROTONIX) 40 MG tablet Take 40 mg by mouth daily. Reported on 04/15/2016    . PROAIR HFA 108 (90 Base) MCG/ACT inhaler INHALE 2 PUFFS INTO LUNGS FOUR TIMES DAILY  12  . traMADol (ULTRAM) 50 MG tablet TAKE 1 TABLET AS NEEDED EVERY 6 HOURS  0  . traZODone (DESYREL) 50 MG tablet Take 1 tablet (50 mg total) by mouth at bedtime. 30 tablet 2   No facility-administered medications prior to visit.     PAST MEDICAL HISTORY: Past Medical History:  Diagnosis Date  . Arthritis   . Degenerative disc disease, cervical   . Diarrhea   . Electrocution 1996   from faulty wiring at pool  . Headache(784.0)    migraines  . Hepatitis HEP C   07/22/16 went through  tx 3 times, clear at this time  . Stroke (cerebrum) (HCC) 1997  . Vitamin D deficiency     PAST SURGICAL HISTORY: Past Surgical History:  Procedure Laterality Date  . CERVICAL DISC SURGERY  08/2015   fusion, pinched nerves  . COLONOSCOPY  03/11/2010  . ESOPHAGOGASTRODUODENOSCOPY  03/11/2010  . KNEE SURGERY Bilateral    2 surgeries on L, one on R- arthroscopic   . LIVER BIOPSY  05/20/2010    FAMILY HISTORY: Family History  Problem Relation Age of Onset  . Ovarian cancer Sister   . Ovarian cancer Sister   . Multiple sclerosis Sister   . Healthy Sister   . Pneumonia Brother   . Down syndrome Brother   . Healthy Daughter   . Depression  Mother   . Parkinson's disease Mother   . Drug abuse Father   . Healthy Paternal Grandfather     SOCIAL HISTORY:  Social History   Social History  . Marital status: Single    Spouse name: N/A  . Number of children: 4  . Years of education: 74   Occupational History  .      disabled   Social History Main Topics  . Smoking status: Current Every Day Smoker    Packs/day: 1.00    Types: Cigarettes  . Smokeless tobacco: Never Used     Comment: Vapor, 07/22/16 smoking 10-15 cigs daily  . Alcohol use Yes     Comment: 04-15-2016 per pt 2-3 beer per day, 07/22/16 1-2  beers daily  . Drug use: No     Comment: 04-15-2016 per pt no. Per pt it's been years since using Meth   . Sexual activity: Not Currently   Other Topics Concern  . Not on file   Social History Narrative   Lives with daughter    caffeine use- coffee- 3 cups daily     PHYSICAL EXAM  GENERAL EXAM/CONSTITUTIONAL: Vitals:  Vitals:   10/22/16 0804  BP: 132/82  Pulse: 78  Weight: 182 lb 9.6 oz (82.8 kg)   Body mass index is 28.6 kg/m. No exam data present  Patient is in no distress; well developed, nourished and groomed  NECK HAS DECR RANGE OF MOTION  CARDIOVASCULAR:  Examination of carotid arteries is normal; no carotid bruits  Regular rate and rhythm, no murmurs  Examination of peripheral vascular system by observation and palpation is normal  EYES:  Ophthalmoscopic exam of optic discs and posterior segments is normal; no papilledema or hemorrhages  MUSCULOSKELETAL:  Gait, strength, tone, movements noted in Neurologic exam below  NEUROLOGIC: MENTAL STATUS:  MMSE - Mini Mental State Exam 10/22/2016 07/22/2016  Orientation to time 4 5  Orientation to Place 5 5  Registration 3 3  Attention/ Calculation 3 3  Recall 1 2  Language- name 2 objects 2 2  Language- repeat 1 1  Language- follow 3 step command 3 2  Language- follow 3 step command-comments - "You don't want me to crease it, do you?"    Language- read & follow direction 1 1  Write a sentence 1 1  Copy design 1 0  Total score 25 25    awake, alert, oriented to person, place and time  recent and remote memory intact  normal attention and concentration  language fluent, comprehension intact, naming intact,   fund of knowledge appropriate  JOVIAL; LAUGHING  CRANIAL NERVE:   2nd - no papilledema on fundoscopic exam  2nd, 3rd, 4th, 6th - pupils equal and  reactive to light, visual fields full to confrontation, extraocular muscles intact, no nystagmus  5th - facial sensation symmetric  7th - facial strength symmetric  8th - hearing intact  9th - palate elevates symmetrically, uvula midline  11th - shoulder shrug symmetric  12th - tongue protrusion midline  MOTOR:   normal bulk and tone, full strength in the BUE, BLE  SLOW MOTOR MOVEMENTS THROUGHOUT  SENSORY:   normal and symmetric to light touch, temperature, vibration  COORDINATION:   finger-nose-finger, fine finger movements normal  REFLEXES:   deep tendon reflexes TRACE and symmetric  GAIT/STATION:   narrow based gait; SLOW CAUTIOUS ANTALGIC GAIT; USES WOOD WALKING STICK    DIAGNOSTIC DATA (LABS, IMAGING, TESTING) - I reviewed patient records, labs, notes, testing and imaging myself where available.  Lab Results  Component Value Date   WBC 8.3 07/29/2011   HGB 16.7 07/29/2011   HCT 51.0 07/29/2011   MCV 101.6 (H) 07/29/2011   PLT 182 07/29/2011      Component Value Date/Time   NA 140 04/26/2011   K 4.5 04/26/2011   No results found for: CHOL, HDL, LDLCALC, LDLDIRECT, TRIG, CHOLHDL No results found for: [id number] Lab Results  Component Value Date   VITAMINB12 336 07/22/2016   Lab Results  Component Value Date   TSH 2.370 07/22/2016    02/04/13 CT head [I reviewed images myself and agree with interpretation. -VRP]  1. Slight progression of multilevel spondylosis in the cervical spine. 2. Mild central and moderate  bilateral foraminal narrowing at C3-4. 3. Similar appearance of moderate central and bilateral foraminal narrowing at C4-5. 4. Slight progression of moderate central canal narrowing at C5-6. 5. Moderate foraminal narrowing bilaterally at C5-6. 6. Mild to moderate central and moderate foraminal narrowing bilaterally at C6-7. 7. Moderate left and mild right foraminal stenosis at C7-T1 due to asymmetric facet hypertrophy.  06/13/15 MRI cervical spine [I reviewed images myself and agree with interpretation. -VRP]  1. Slight progression of multilevel spondylosis in the cervical spine. 2. Mild central and moderate bilateral foraminal narrowing at C3-4. 3. Similar appearance of moderate central and bilateral foraminal narrowing at C4-5. 4. Slight progression of moderate central canal narrowing at C5-6. 5. Moderate foraminal narrowing bilaterally at C5-6. 6. Mild to moderate central and moderate foraminal narrowing bilaterally at C6-7. 7. Moderate left and mild right foraminal stenosis at C7-T1 due to asymmetric facet hypertrophy.  06/13/15 MRI lumbar [I reviewed images myself and agree with interpretation. -VRP]  1. Disc degeneration at L3-L4 and L4-L5. At the latter there is right foraminal disc resulting in moderate foraminal stenosis. Query right L4 radiculitis.  2. No lumbar or lower thoracic spinal stenosis. Mild to moderate lower thoracic neural foraminal stenosis due to combined disc and facet degeneration. 3. Mild increased T2 signal in the visualized lower thoracic spinal cord, favored to be inconsequential (such as benign prominence of the central spinal canal or ventriculus terminalis variant). But if there are symptoms referable to the thoracic spinal cord, recommend followup thoracic spine MRI (without and with contrast preferred).  07/19/15 EMG/NCS - This is a normal study. No diagnostic evidence of large fiber neuropathy or lumbar radiculopathy at this time. Given the clinical context, a  small fiber neuropathy due to hepatitis C and chronic alcohol abuse may be considered. Small fiber neuropathies are not usually detected with routine nerve conduction studies.  08/14/16 MRI brain - MRI scan of the brain showing scattered supratentorial nonspecific periventricular and subcortical white matter hyperintensities likely due  to chronic microvascular ischemia. These appear unchanged compared with previous MRI scan dated 02/08/2013 but the paranasal sinusitis changes appear slightly improved     ASSESSMENT AND PLAN  55 y.o. year old male here with progressive short-term memory problems, forgetting appointments, forgetting conversations, repeating himself, forgetting pin numbers and combination locks. May represent mild cognitive impairment or secondary . Will proceed with further workup.   Ddx: memory loss (due to depression, pain, insomnia, chronic alcohol abuse) vs MCI  1. Memory loss   2. MCI (mild cognitive impairment) with memory loss   3. Pain of lower extremity, unspecified laterality      PLAN: I spent 15 minutes of face to face time with patient. Greater than 50% of time was spent in counseling and coordination of care with patient. In summary we discussed: - continue psychiatry/psychology treatments for depression - continue to cut down etoh and tobacco use - brain healthy habits reviewed  Return in about 6 months (around 04/22/2017).    Suanne MarkerVIKRAM R. Devri Kreher, MD 10/22/2016, 8:29 AM Certified in Neurology, Neurophysiology and Neuroimaging  [facility] [address], [address] [place], [place] [id number] [phone]

## 2016-10-29 ENCOUNTER — Ambulatory Visit: Payer: Self-pay | Admitting: Diagnostic Neuroimaging

## 2016-10-30 ENCOUNTER — Ambulatory Visit (INDEPENDENT_AMBULATORY_CARE_PROVIDER_SITE_OTHER): Payer: Medicaid Other | Admitting: Psychology

## 2016-10-30 DIAGNOSIS — F332 Major depressive disorder, recurrent severe without psychotic features: Secondary | ICD-10-CM | POA: Diagnosis not present

## 2016-11-06 NOTE — Progress Notes (Signed)
The patient reports that he is completely the stop drinking alcohol and while they're major stressors going on between he and his son-in-law and he is essentially house that he is miserable he is coping relatively well. He reports that he simply tries to stay away from the son-in-law who is essentially a functional alcoholic. The son-in-law tries to get the patient to drink at times and other times there angry and fighting.

## 2016-11-14 NOTE — Progress Notes (Signed)
Patient has continued to be stressed about living situation.  He has not been drinking at all and doing better with cravings.  He has contuend to have physical impairments and is unable to move around much at all.

## 2016-11-17 ENCOUNTER — Other Ambulatory Visit: Payer: Self-pay | Admitting: Nurse Practitioner

## 2016-11-17 ENCOUNTER — Ambulatory Visit (HOSPITAL_COMMUNITY): Payer: Self-pay | Admitting: Psychology

## 2016-11-17 DIAGNOSIS — M5416 Radiculopathy, lumbar region: Secondary | ICD-10-CM

## 2016-11-18 ENCOUNTER — Ambulatory Visit (INDEPENDENT_AMBULATORY_CARE_PROVIDER_SITE_OTHER): Payer: Medicaid Other | Admitting: Psychiatry

## 2016-11-18 ENCOUNTER — Encounter (HOSPITAL_COMMUNITY): Payer: Self-pay | Admitting: Psychiatry

## 2016-11-18 VITALS — BP 154/93 | HR 84 | Ht 67.0 in | Wt 179.8 lb

## 2016-11-18 DIAGNOSIS — Z818 Family history of other mental and behavioral disorders: Secondary | ICD-10-CM

## 2016-11-18 DIAGNOSIS — Z79899 Other long term (current) drug therapy: Secondary | ICD-10-CM

## 2016-11-18 DIAGNOSIS — Z888 Allergy status to other drugs, medicaments and biological substances status: Secondary | ICD-10-CM

## 2016-11-18 DIAGNOSIS — Z8489 Family history of other specified conditions: Secondary | ICD-10-CM

## 2016-11-18 DIAGNOSIS — F332 Major depressive disorder, recurrent severe without psychotic features: Secondary | ICD-10-CM | POA: Diagnosis not present

## 2016-11-18 DIAGNOSIS — Z8041 Family history of malignant neoplasm of ovary: Secondary | ICD-10-CM

## 2016-11-18 DIAGNOSIS — Z9889 Other specified postprocedural states: Secondary | ICD-10-CM | POA: Diagnosis not present

## 2016-11-18 DIAGNOSIS — Z813 Family history of other psychoactive substance abuse and dependence: Secondary | ICD-10-CM

## 2016-11-18 DIAGNOSIS — F17210 Nicotine dependence, cigarettes, uncomplicated: Secondary | ICD-10-CM | POA: Diagnosis not present

## 2016-11-18 DIAGNOSIS — F1099 Alcohol use, unspecified with unspecified alcohol-induced disorder: Secondary | ICD-10-CM

## 2016-11-18 MED ORDER — DULOXETINE HCL 60 MG PO CPEP: 60.0000 mg | ORAL_CAPSULE | Freq: Two times a day (BID) | ORAL | 2 refills | Status: DC

## 2016-11-18 MED ORDER — TRAZODONE HCL 50 MG PO TABS: 50.0000 mg | ORAL_TABLET | Freq: Every day | ORAL | 2 refills | Status: DC

## 2016-11-18 NOTE — Progress Notes (Signed)
Psychiatric Initial Adult Assessment   Patient Identification: Cody Collins MRN:  [id number] Date of Evaluation:  11/18/2016 Referral Source: Dr. Dimas Aguas, [facility] Chief Complaint:    Visit Diagnosis:    ICD-9-CM ICD-10-CM   1. Severe episode of recurrent major depressive disorder, without psychotic features (HCC) 296.33 F33.2     History of Present Illness:  This patient is a 56 year old divorced white male who lives with his daughter her husband and their 2 children in [place]. 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  The patient was referred by Dr. Dimas Aguas for further treatment and assessment of depression and anxiety.  The patient states that he has had no previous mental health treatment per se. He is been depressed on and off for years. Much of this started a few years ago when he began treatment for hepatitis C and interferon made him feel sad and depressed. He's been primarily on Zoloft. Lately however he feels like his life is falling apart because of health issues. He states that he is always been "strong and able to work.". 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 He's had to quit work over the last year due to severe arthritis in his knees and legs. Last year he also had to have cervical disc surgery which is now failed and he is having severe numbness and pain down his left leg. His primary care physician tried to start him on Cymbalta but at the time he had no insurance and no way to pay for. He has run out of funds and has to stay with his daughter which is bothering him as well.  The patient has a long history of substance abuse. 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 He used it on and off for a number of years but stopped after he had his children. He has twins that are 61 and a daughter that is 50. His wife was using far more than he was and he ended up gaining custody of the children  when they were very young and he raised them. He had an affair with her babysitter later and she became pregnant and had a boy which she did not find out about until the child was 57 years old.  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 Unfortunately after that he began using methamphetamine and marijuana and alcohol again. He states that he was drinking fairly heavily until about 2 weeks ago but now he is down to 2-3 beers a day from 5 or 6 a day. He is no longer using any drugs.  Currently the patient states that his mood is somewhat depressed despite being on Zoloft 100 mg daily. He feels sad and overwhelmed. He can't sleep often due to pain. His energy is poor and he is irritable and grumpy. He is often nervous and anxious but denies panic attacks. He has been avoiding people. He's never been suicidal or homicidal. Sometimes he has obsessional repetitive thoughts. He laughs inappropriately all the time he states he can help himself as as it is his way of relieving stress  The patient returns after 4 weeks For the most part he is doing okay. Last month he was very obsessional worried. I increased his Cymbalta to 60 mg twice a day and it seems to helped. He  is trying to get on disability and once this happens he's going to move to [place] to be with his other daughters he doesn't like the situation at home or his son-in-law's drinking and they are not getting along. He is sleeping fairly well. He notes that he still avoids people but he does what he has to do in the community. He has not drank any alcohol for 3 months  Associated Signs/Symptoms: Depression Symptoms:  depressed mood, anhedonia, insomnia, fatigue, feelings of worthlessness/guilt, anxiety, loss of energy/fatigue, disturbed sleep, (Hypo) Manic Symptoms:  Irritable Mood, Anxiety Symptoms:  Excessive Worry,   Past Psychiatric  History: None, other than treatment by primary care  Previous Psychotropic Medications: Yes   Substance Abuse History in the last 12 months:  Yes.    Consequences of Substance Abuse: Medical Consequences:  History of hepatitis C Legal Consequences:  History of one DUI  Past Medical History:  Past Medical History:  Diagnosis Date  . Arthritis   . Degenerative disc disease, cervical   . Diarrhea   . Electrocution 1996   from faulty wiring at pool  . Headache(784.0)    migraines  . Hepatitis HEP C   07/22/16 went through tx 3 times, clear at this time  . Stroke (cerebrum) (HCC) 1997  . Vitamin D deficiency     Past Surgical History:  Procedure Laterality Date  . CERVICAL DISC SURGERY  08/2015   fusion, pinched nerves  . COLONOSCOPY  03/11/2010  . ESOPHAGOGASTRODUODENOSCOPY  03/11/2010  . KNEE SURGERY Bilateral    2 surgeries on L, one on R- arthroscopic   . LIVER BIOPSY  05/20/2010    Family Psychiatric History: The patient's father was a heavy drug user as well. The mother has a history of depression  Family History:  Family History  Problem Relation Age of Onset  . Ovarian cancer Sister   . Ovarian cancer Sister   . Multiple sclerosis Sister   . Healthy Sister   . Pneumonia Brother   . Down syndrome Brother   . Healthy Daughter   . Depression Mother   . Parkinson's disease Mother   . Drug abuse Father   . Healthy Paternal Grandfather     Social History:   Social History   Social History  . Marital status: Single    Spouse name: N/A  . Number of children: 4  . Years of education: 71   Occupational History  .      disabled   Social History Main Topics  . Smoking status: Current Every Day Smoker    Packs/day: 1.00    Types: Cigarettes  . Smokeless tobacco: Never Used     Comment: Vapor, 07/22/16 smoking 10-15 cigs daily  . Alcohol use Yes     Comment: 04-15-2016 per pt 2-3 beer per day, 07/22/16 1-2  beers daily  . Drug use: No     Comment:  04-15-2016 per pt no. Per pt it's been years since using Meth   . Sexual activity: Not Currently   Other Topics Concern  . None   Social History Narrative   Lives with daughter    caffeine use- coffee- 3 cups daily    Additional Social History: The patient grew up in [place] with his mother and 5 siblings. His father really was not part of the family. He never finished high school but left for [place] with his girlfriend at age 46. As noted above he used drugs heavily for a number of years.  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 He has been married once and divorced when his children were very young. He had another child with a babysitter whom he found out about years later. He is currently unemployed and applying for disability  Allergies:   Allergies  Allergen Reactions  . Ciprofloxacin Hcl     Per pt Throat Swells   . Codeine Itching  . Vitamin C [Ascorbic Acid]     Patient states that this causes problems with his ears.    Metabolic Disorder Labs: No results found for: HGBA1C, MPG No results found for: PROLACTIN No results found for: CHOL, TRIG, HDL, CHOLHDL, VLDL, LDLCALC   Current Medications: Current Outpatient Prescriptions  Medication Sig Dispense Refill  . acetaminophen (TYLENOL) 100 MG/ML solution Take by mouth every 4 (four) hours as needed. Reported on 04/15/2016    . Albuterol Sulfate (PROAIR HFA IN) Inhale into the lungs.      Marland Kitchen allopurinol (ZYLOPRIM) 300 MG tablet Take 300 mg by mouth daily. As needed  5  . cholecalciferol (VITAMIN D) 1000 units tablet Take 1,000 Units by mouth daily.    . colchicine 0.6 MG tablet TAKE 2 TABLETS BY MOUTH ONCE THEN 1 TABLET ONE HOUR LATER MAY REPEAT NEXT DAY IF NEEDED  1  . DULoxetine (CYMBALTA) 60 MG capsule Take 1 capsule (60 mg total) by mouth 2 (two) times daily. 60 capsule 2  . fluticasone (FLONASE) 50 MCG/ACT nasal spray INSTILL 2 SPRAYS IN NOSTRIL(S) EVERY MORNING  12  . gabapentin (NEURONTIN) 400 MG capsule Take  400 mg by mouth at bedtime.  5  . ibuprofen (ADVIL,MOTRIN) 800 MG tablet Take 800 mg by mouth every 8 (eight) hours as needed.    . loratadine (CLARITIN) 10 MG tablet Take 10 mg by mouth daily. Reported on 04/15/2016    . pantoprazole (PROTONIX) 40 MG tablet Take 40 mg by mouth daily. Reported on 04/15/2016    . PROAIR HFA 108 (90 Base) MCG/ACT inhaler INHALE 2 PUFFS INTO LUNGS FOUR TIMES DAILY  12  . traMADol (ULTRAM) 50 MG tablet TAKE 1 TABLET AS NEEDED EVERY 6 HOURS  0  . traZODone (DESYREL) 50 MG tablet Take 1 tablet (50 mg total) by mouth at bedtime. 30 tablet 2   No current facility-administered medications for this visit.     Neurologic: Headache: Yes Seizure: No Paresthesias:Yes  Musculoskeletal: Strength & Muscle Tone: decreased Gait & Station: unsteady Patient leans: N/A  Psychiatric Specialty Exam: Review of Systems  Constitutional: Positive for malaise/fatigue.  HENT: Positive for congestion.   Genitourinary: Positive for frequency.  Musculoskeletal: Positive for back pain, joint pain and neck pain.  Neurological: Positive for tingling, sensory change and focal weakness.  Psychiatric/Behavioral: Positive for depression. The patient is nervous/anxious and has insomnia.     Blood pressure (!) 154/93, pulse 84, height 5\' 7"  (1.702 m), weight 179 lb 12.8 oz (81.6 kg), SpO2 94 %.Body mass index is 28.16 kg/m.  General Appearance: Casual and Fairly Groomed  Eye Contact:  Good  Speech:  Clear and Coherent  Volume:  Normal   Mood:Anxious   Affect: Brighter, less depressed   Thought Process:  Coherent  Orientation:  Full (Time, Place, and Person)  Thought Content:  Rumination,  Suicidal Thoughts:  No  Homicidal Thoughts:  No  Memory:  Immediate;   Good Recent;   Fair Remote;   Fair  Judgement:  Fair  Insight:  Fair  Psychomotor Activity:  Restlessness  Concentration:  Concentration:  Fair and Attention Span: Fair  Recall:  [employer]Fair  Fund of Knowledge:Fair  Language:  Good  Akathisia:  No  Handed:  Right  AIMS (if indicated):    Assets:  Communication Skills Desire for Improvement Resilience Social Support  ADL's:  Intact  Cognition: WNL  Sleep:  poor    Treatment Plan Summary: Medication management   Patient will continue Cymbalta60 mgtwice a day and trazodone 50 mg daily at bedtime. He will keep his appointment with Sudie BaileyJohn Rodenbaugh and return to see me in 3 months   Diannia RuderOSS, DEBORAH, MD 1/16/20182:47 PM

## 2016-11-19 ENCOUNTER — Ambulatory Visit (HOSPITAL_COMMUNITY): Payer: Self-pay | Admitting: Psychology

## 2016-11-25 ENCOUNTER — Ambulatory Visit
Admission: RE | Admit: 2016-11-25 | Discharge: 2016-11-25 | Disposition: A | Discharge status: Home / Self Care | Payer: Medicaid Other | Source: Ambulatory Visit | Attending: Nurse Practitioner | Admitting: Nurse Practitioner

## 2016-11-25 DIAGNOSIS — M5416 Radiculopathy, lumbar region: Secondary | ICD-10-CM

## 2016-11-25 MED ORDER — IOPAMIDOL (ISOVUE-M 200) INJECTION 41%
1.0000 mL | Freq: Once | INTRAMUSCULAR | Status: AC
  Administered 2016-11-25: 1 mL via EPIDURAL

## 2016-11-25 MED ORDER — METHYLPREDNISOLONE ACETATE 40 MG/ML INJ SUSP (RADIOLOG
120.0000 mg | Freq: Once | INTRAMUSCULAR | Status: AC
  Administered 2016-11-25: 120 mg via EPIDURAL

## 2016-11-25 NOTE — Discharge Instructions (Signed)

## 2017-01-01 ENCOUNTER — Other Ambulatory Visit: Payer: Self-pay | Admitting: Nurse Practitioner

## 2017-01-01 DIAGNOSIS — M5416 Radiculopathy, lumbar region: Secondary | ICD-10-CM

## 2017-01-07 ENCOUNTER — Ambulatory Visit
Admission: RE | Admit: 2017-01-07 | Discharge: 2017-01-07 | Disposition: A | Discharge status: Home / Self Care | Payer: Medicaid Other | Source: Ambulatory Visit | Attending: Nurse Practitioner | Admitting: Nurse Practitioner

## 2017-01-07 DIAGNOSIS — M5416 Radiculopathy, lumbar region: Secondary | ICD-10-CM

## 2017-01-07 MED ORDER — IOPAMIDOL (ISOVUE-M 200) INJECTION 41%
1.0000 mL | Freq: Once | INTRAMUSCULAR | Status: AC
  Administered 2017-01-07: 1 mL via EPIDURAL

## 2017-01-07 MED ORDER — METHYLPREDNISOLONE ACETATE 40 MG/ML INJ SUSP (RADIOLOG
120.0000 mg | Freq: Once | INTRAMUSCULAR | Status: AC
  Administered 2017-01-07: 120 mg via EPIDURAL

## 2017-02-11 ENCOUNTER — Ambulatory Visit (INDEPENDENT_AMBULATORY_CARE_PROVIDER_SITE_OTHER): Payer: Medicaid Other | Admitting: Psychiatry

## 2017-02-11 ENCOUNTER — Encounter (HOSPITAL_COMMUNITY): Payer: Self-pay | Admitting: Psychiatry

## 2017-02-11 VITALS — BP 136/92 | HR 92 | Ht 67.0 in | Wt 180.0 lb

## 2017-02-11 DIAGNOSIS — F332 Major depressive disorder, recurrent severe without psychotic features: Secondary | ICD-10-CM | POA: Diagnosis not present

## 2017-02-11 DIAGNOSIS — Z813 Family history of other psychoactive substance abuse and dependence: Secondary | ICD-10-CM

## 2017-02-11 DIAGNOSIS — Z79899 Other long term (current) drug therapy: Secondary | ICD-10-CM

## 2017-02-11 DIAGNOSIS — Z818 Family history of other mental and behavioral disorders: Secondary | ICD-10-CM | POA: Diagnosis not present

## 2017-02-11 DIAGNOSIS — F17210 Nicotine dependence, cigarettes, uncomplicated: Secondary | ICD-10-CM | POA: Diagnosis not present

## 2017-02-11 MED ORDER — DULOXETINE HCL 60 MG PO CPEP: 60.0000 mg | ORAL_CAPSULE | Freq: Two times a day (BID) | ORAL | 2 refills | Status: DC

## 2017-02-11 MED ORDER — TRAZODONE HCL 100 MG PO TABS: 100.0000 mg | ORAL_TABLET | Freq: Every day | ORAL | 2 refills | Status: DC

## 2017-02-11 NOTE — Progress Notes (Signed)
Psychiatric Initial Adult Assessment   Patient Identification: Cody Collins MRN:  [id number] Date of Evaluation:  02/11/2017 Referral Source: Dr. Dimas Aguas, [facility] Chief Complaint:   Chief Complaint    Depression; Anxiety; Follow-up     Visit Diagnosis:    ICD-9-CM ICD-10-CM   1. Severe episode of recurrent major depressive disorder, without psychotic features (HCC) 296.33 F33.2     History of Present Illness:  This patient is a 56 year old divorced white male who lives with his daughter her husband and their 2 children in [place]. 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  The patient was referred by Dr. Dimas Aguas for further treatment and assessment of depression and anxiety.  The patient states that he has had no previous mental health treatment per se. He is been depressed on and off for years. Much of this started a few years ago when he began treatment for hepatitis C and interferon made him feel sad and depressed. He's been primarily on Zoloft. Lately however he feels like his life is falling apart because of health issues. He states that he is always been "strong and able to work.". 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 He's had to quit work over the last year due to severe arthritis in his knees and legs. Last year he also had to have cervical disc surgery which is now failed and he is having severe numbness and pain down his left leg. His primary care physician tried to start him on Cymbalta but at the time he had no insurance and no way to pay for. He has run out of funds and has to stay with his daughter which is bothering him as well.  The patient has a long history of substance abuse. 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 He used it on and off for a number of years but stopped after he had his children. He has twins that are 53 and a daughter that is 64. His wife was using far more than  he was and he ended up gaining custody of the children when they were very young and he raised them. He had an affair with her babysitter later and she became pregnant and had a boy which she did not find out about until the child was 65 years old.  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 Unfortunately after that he began using methamphetamine and marijuana and alcohol again. He states that he was drinking fairly heavily until about 2 weeks ago but now he is down to 2-3 beers a day from 5 or 6 a day. He is no longer using any drugs.  Currently the patient states that his mood is somewhat depressed despite being on Zoloft 100 mg daily. He feels sad and overwhelmed. He can't sleep often due to pain. His energy is poor and he is irritable and grumpy. He is often nervous and anxious but denies panic attacks. He has been avoiding people. He's never been suicidal or homicidal. Sometimes he has obsessional repetitive thoughts. He laughs inappropriately all the time he states he can help himself as as it is his way of relieving stress  The patient returns after 3 months. He states for the most part he is doing okay. He's having some trouble sleeping and is worried a lot  about getting his disability. He went to visit his daughters in [place] and wants to move there but can't do it until the disability is approved. I told him we could try an increase in his trazodone. His depression is on and off but he thinks the medication is helping and he is no longer drinking or using any drugs  Associated Signs/Symptoms: Depression Symptoms:  depressed mood, anhedonia, insomnia, fatigue, feelings of worthlessness/guilt, anxiety, loss of energy/fatigue, disturbed sleep, (Hypo) Manic Symptoms:  Irritable Mood, Anxiety Symptoms:  Excessive Worry,   Past Psychiatric History: None, other than treatment by primary  care  Previous Psychotropic Medications: Yes   Substance Abuse History in the last 12 months:  Yes.    Consequences of Substance Abuse: Medical Consequences:  History of hepatitis C Legal Consequences:  History of one DUI  Past Medical History:  Past Medical History:  Diagnosis Date  . Arthritis   . Degenerative disc disease, cervical   . Diarrhea   . Electrocution 1996   from faulty wiring at pool  . Headache(784.0)    migraines  . Hepatitis HEP C   07/22/16 went through tx 3 times, clear at this time  . Stroke (cerebrum) (HCC) 1997  . Vitamin D deficiency     Past Surgical History:  Procedure Laterality Date  . CERVICAL DISC SURGERY  08/2015   fusion, pinched nerves  . COLONOSCOPY  03/11/2010  . ESOPHAGOGASTRODUODENOSCOPY  03/11/2010  . KNEE SURGERY Bilateral    2 surgeries on L, one on R- arthroscopic   . LIVER BIOPSY  05/20/2010    Family Psychiatric History: The patient's father was a heavy drug user as well. The mother has a history of depression  Family History:  Family History  Problem Relation Age of Onset  . Ovarian cancer Sister   . Ovarian cancer Sister   . Multiple sclerosis Sister   . Healthy Sister   . Pneumonia Brother   . Down syndrome Brother   . Healthy Daughter   . Depression Mother   . Parkinson's disease Mother   . Drug abuse Father   . Healthy Paternal Grandfather     Social History:   Social History   Social History  . Marital status: Single    Spouse name: N/A  . Number of children: 4  . Years of education: 23   Occupational History  .      disabled   Social History Main Topics  . Smoking status: Current Every Day Smoker    Packs/day: 1.00    Types: Cigarettes  . Smokeless tobacco: Never Used     Comment: Vapor, 07/22/16 smoking 10-15 cigs daily  . Alcohol use Yes     Comment: 04-15-2016 per pt 2-3 beer per day, 07/22/16 1-2  beers daily  . Drug use: No     Comment: 04-15-2016 per pt no. Per pt it's been years since using  Meth   . Sexual activity: Not Currently   Other Topics Concern  . None   Social History Narrative   Lives with daughter    caffeine use- coffee- 3 cups daily    Additional Social History: The patient grew up in [place] with his mother and 5 siblings. His father really was not part of the family. He never finished high school but left for [place] with his girlfriend at age 28. As noted above he used drugs heavily for a number of years. 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 He has been  married once and divorced when his children were very young. He had another child with a babysitter whom he found out about years later. He is currently unemployed and applying for disability  Allergies:   Allergies  Allergen Reactions  . Ciprofloxacin Hcl Swelling    Per pt Throat Swells   . Codeine Itching  . Vitamin C [Ascorbic Acid] Other (See Comments)    Patient states that this causes problems with his ears.    Metabolic Disorder Labs: No results found for: HGBA1C, MPG No results found for: PROLACTIN No results found for: CHOL, TRIG, HDL, CHOLHDL, VLDL, LDLCALC   Current Medications: Current Outpatient Prescriptions  Medication Sig Dispense Refill  . acetaminophen (TYLENOL) 100 MG/ML solution Take by mouth every 4 (four) hours as needed. Reported on 04/15/2016    . Albuterol Sulfate (PROAIR HFA IN) Inhale into the lungs.      Marland Kitchen allopurinol (ZYLOPRIM) 300 MG tablet Take 300 mg by mouth daily. As needed  5  . cholecalciferol (VITAMIN D) 1000 units tablet Take 1,000 Units by mouth daily.    . colchicine 0.6 MG tablet TAKE 2 TABLETS BY MOUTH ONCE THEN 1 TABLET ONE HOUR LATER MAY REPEAT NEXT DAY IF NEEDED  1  . DULoxetine (CYMBALTA) 60 MG capsule Take 1 capsule (60 mg total) by mouth 2 (two) times daily. 60 capsule 2  . fluticasone (FLONASE) 50 MCG/ACT nasal spray INSTILL 2 SPRAYS IN NOSTRIL(S) EVERY MORNING  12  . gabapentin (NEURONTIN) 400 MG capsule Take 300 mg by mouth 3 (three)  times daily.   5  . ibuprofen (ADVIL,MOTRIN) 800 MG tablet Take 800 mg by mouth every 8 (eight) hours as needed.    . loratadine (CLARITIN) 10 MG tablet Take 10 mg by mouth daily. Reported on 04/15/2016    . pantoprazole (PROTONIX) 40 MG tablet Take 40 mg by mouth daily. Reported on 04/15/2016    . PROAIR HFA 108 (90 Base) MCG/ACT inhaler INHALE 2 PUFFS INTO LUNGS FOUR TIMES DAILY  12  . traMADol (ULTRAM) 50 MG tablet TAKE 1 TABLET AS NEEDED EVERY 6 HOURS  0  . traZODone (DESYREL) 100 MG tablet Take 1 tablet (100 mg total) by mouth at bedtime. 30 tablet 2   No current facility-administered medications for this visit.     Neurologic: Headache: Yes Seizure: No Paresthesias:Yes  Musculoskeletal: Strength & Muscle Tone: decreased Gait & Station: unsteady Patient leans: N/A  Psychiatric Specialty Exam: Review of Systems  Constitutional: Positive for malaise/fatigue.  HENT: Positive for congestion.   Genitourinary: Positive for frequency.  Musculoskeletal: Positive for back pain, joint pain and neck pain.  Neurological: Positive for tingling, sensory change and focal weakness.  Psychiatric/Behavioral: Positive for depression. The patient is nervous/anxious and has insomnia.     Blood pressure (!) 136/92, pulse 92, height  (1.702 m), weight 180 lb (81.6 kg).Body mass index is 28.19 kg/m.  General Appearance: Casual and Fairly Groomed  Eye Contact:  Good  Speech:  Clear and Coherent  Volume:  Normal   Mood:Anxious   Affect: Congruent   Thought Process:  Coherent  Orientation:  Full (Time, Place, and Person)  Thought Content:  Rumination,  Suicidal Thoughts:  No  Homicidal Thoughts:  No  Memory:  Immediate;   Good Recent;   Fair Remote;   Fair  Judgement:  Fair  Insight:  Fair  Psychomotor Activity:  Restlessness  Concentration:  Concentration: Fair and Attention Span: Fair  Recall:  [employer] of Knowledge:Fair  Language: Good  Akathisia:  No  Handed:  Right  AIMS  (if indicated):    Assets:  Communication Skills Desire for Improvement Resilience Social Support  ADL's:  Intact  Cognition: WNL  Sleep:  poor    Treatment Plan Summary: Medication management   Patient will continue Cymbalta60 mgtwice a day and trazodone Will be increased to 100 mg daily at bedtime. He will return to see me in 3 months   Diannia Ruder, MD 4/11/20182:22 PM

## 2017-03-04 ENCOUNTER — Other Ambulatory Visit: Payer: Self-pay | Admitting: Nurse Practitioner

## 2017-03-04 DIAGNOSIS — M5416 Radiculopathy, lumbar region: Secondary | ICD-10-CM

## 2017-03-05 ENCOUNTER — Telehealth (HOSPITAL_COMMUNITY): Payer: Self-pay | Admitting: *Deleted

## 2017-03-05 NOTE — Telephone Encounter (Signed)
phone call from patient, he said his medicine is not working anymore.   He said he feel like he feels really depressed.   He was offered a slot today, but he was not sure he could get here.   He said he will be okay to see Dr. Tenny Crawoss on tomorrow 03/06/17.

## 2017-03-05 NOTE — Telephone Encounter (Signed)
noted 

## 2017-03-06 ENCOUNTER — Ambulatory Visit (INDEPENDENT_AMBULATORY_CARE_PROVIDER_SITE_OTHER): Payer: Medicaid Other | Admitting: Psychiatry

## 2017-03-06 ENCOUNTER — Encounter (HOSPITAL_COMMUNITY): Payer: Self-pay | Admitting: Psychiatry

## 2017-03-06 VITALS — BP 155/87 | HR 92 | Ht 67.0 in | Wt 175.6 lb

## 2017-03-06 DIAGNOSIS — F332 Major depressive disorder, recurrent severe without psychotic features: Secondary | ICD-10-CM | POA: Diagnosis not present

## 2017-03-06 DIAGNOSIS — Z813 Family history of other psychoactive substance abuse and dependence: Secondary | ICD-10-CM

## 2017-03-06 DIAGNOSIS — F17210 Nicotine dependence, cigarettes, uncomplicated: Secondary | ICD-10-CM | POA: Diagnosis not present

## 2017-03-06 DIAGNOSIS — Z79899 Other long term (current) drug therapy: Secondary | ICD-10-CM

## 2017-03-06 DIAGNOSIS — Z818 Family history of other mental and behavioral disorders: Secondary | ICD-10-CM

## 2017-03-06 MED ORDER — FLUOXETINE HCL 20 MG PO CAPS: 20.0000 mg | ORAL_CAPSULE | Freq: Every day | ORAL | 2 refills | Status: DC

## 2017-03-06 NOTE — Progress Notes (Signed)
Psychiatric Initial Adult Assessment   Patient Identification: Cody Collins MRN:  [id number] Date of Evaluation:  03/06/2017 Referral Source: Dr. Dimas Aguas, [facility] Chief Complaint:   Chief Complaint    Depression; Anxiety; Follow-up     Visit Diagnosis:    ICD-9-CM ICD-10-CM   1. Severe episode of recurrent major depressive disorder, without psychotic features (HCC) 296.33 F33.2     History of Present Illness:  This patient is a 56 year old divorced white male who lives with his daughter her husband and their 2 children in [place]. 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  The patient was referred by Dr. Dimas Aguas for further treatment and assessment of depression and anxiety.  The patient states that he has had no previous mental health treatment per se. He is been depressed on and off for years. Much of this started a few years ago when he began treatment for hepatitis C and interferon made him feel sad and depressed. He's been primarily on Zoloft. Lately however he feels like his life is falling apart because of health issues. He states that he is always been "strong and able to work.". 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 He's had to quit work over the last year due to severe arthritis in his knees and legs. Last year he also had to have cervical disc surgery which is now failed and he is having severe numbness and pain down his left leg. His primary care physician tried to start him on Cymbalta but at the time he had no insurance and no way to pay for. He has run out of funds and has to stay with his daughter which is bothering him as well.  The patient has a long history of substance abuse. 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 He used it on and off for a number of years but stopped after he had his children. He has twins that are 67 and a daughter that is 27. His wife was using far more than  he was and he ended up gaining custody of the children when they were very young and he raised them. He had an affair with her babysitter later and she became pregnant and had a boy which she did not find out about until the child was 68 years old.  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 Unfortunately after that he began using methamphetamine and marijuana and alcohol again. He states that he was drinking fairly heavily until about 2 weeks ago but now he is down to 2-3 beers a day from 5 or 6 a day. He is no longer using any drugs.  Currently the patient states that his mood is somewhat depressed despite being on Zoloft 100 mg daily. He feels sad and overwhelmed. He can't sleep often due to pain. His energy is poor and he is irritable and grumpy. He is often nervous and anxious but denies panic attacks. He has been avoiding people. He's never been suicidal or homicidal. Sometimes he has obsessional repetitive thoughts. He laughs inappropriately all the time he states he can help himself as as it is his way of relieving stress  The patient returns after 3 weeks as a work in. He called yesterday and said he was very depressed. His living situation is going very poorly.  His daughter and son-in-law are really don't want him at the house so most of the time he sleeps in his car. They won't talk to him and he feels totally ignored. He feels like his in-laws are against him as well. He can't really leave [place] because he is waiting on a disability hearing and owes money to attorneys. His daughter for my was coming this week to see what can be done to help him. He was having some thoughts of suicide but claims he would not act on them but feels very low no energy and no interest in doing anything. The Cymbalta no longer seems to help a night suggested we switch to Prozac. When we get our new counselor  this month we will get him in. He is reminded to call 911 or go to the ED if he feels suicidal. He is no longer drinking at all  Associated Signs/Symptoms: Depression Symptoms:  depressed mood, anhedonia, insomnia, fatigue, feelings of worthlessness/guilt, anxiety, loss of energy/fatigue, disturbed sleep, (Hypo) Manic Symptoms:  Irritable Mood, Anxiety Symptoms:  Excessive Worry,   Past Psychiatric History: None, other than treatment by primary care  Previous Psychotropic Medications: Yes   Substance Abuse History in the last 12 months:  Yes.    Consequences of Substance Abuse: Medical Consequences:  History of hepatitis C Legal Consequences:  History of one DUI  Past Medical History:  Past Medical History:  Diagnosis Date  . Arthritis   . Degenerative disc disease, cervical   . Diarrhea   . Electrocution 1996   from faulty wiring at pool  . Headache(784.0)    migraines  . Hepatitis HEP C   07/22/16 went through tx 3 times, clear at this time  . Stroke (cerebrum) (HCC) 1997  . Vitamin D deficiency     Past Surgical History:  Procedure Laterality Date  . CERVICAL DISC SURGERY  08/2015   fusion, pinched nerves  . COLONOSCOPY  03/11/2010  . ESOPHAGOGASTRODUODENOSCOPY  03/11/2010  . KNEE SURGERY Bilateral    2 surgeries on L, one on R- arthroscopic   . LIVER BIOPSY  05/20/2010    Family Psychiatric History: The patient's father was a heavy drug user as well. The mother has a history of depression  Family History:  Family History  Problem Relation Age of Onset  . Ovarian cancer Sister   . Ovarian cancer Sister   . Multiple sclerosis Sister   . Healthy Sister   . Pneumonia Brother   . Down syndrome Brother   . Healthy Daughter   . Depression Mother   . Parkinson's disease Mother   . Drug abuse Father   . Healthy Paternal Grandfather     Social History:   Social History   Social History  . Marital status: Single    Spouse name: N/A  . Number of  children: 4  . Years of education: 3811   Occupational History  .      disabled   Social History Main Topics  . Smoking status: Current Every Day Smoker    Packs/day: 1.00    Types: Cigarettes  . Smokeless tobacco: Never Used     Comment: Vapor, 07/22/16 smoking 10-15 cigs daily  . Alcohol use Yes     Comment: 04-15-2016 per pt 2-3 beer per day, 07/22/16 1-2  beers daily  . Drug use: No     Comment: 04-15-2016 per pt no. Per pt it's been years since using Meth   . Sexual activity:  Not Currently   Other Topics Concern  . None   Social History Narrative   Lives with daughter    caffeine use- coffee- 3 cups daily    Additional Social History: The patient grew up in [place] with his mother and 5 siblings. His father really was not part of the family. He never finished high school but left for [place] with his girlfriend at age 72. As noted above he used drugs heavily for a number of years. 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 He has been married once and divorced when his children were very young. He had another child with a babysitter whom he found out about years later. He is currently unemployed and applying for disability  Allergies:   Allergies  Allergen Reactions  . Ciprofloxacin Hcl Swelling    Per pt Throat Swells   . Codeine Itching  . Vitamin C [Ascorbic Acid] Other (See Comments)    Patient states that this causes problems with his ears.    Metabolic Disorder Labs: No results found for: HGBA1C, MPG No results found for: PROLACTIN No results found for: CHOL, TRIG, HDL, CHOLHDL, VLDL, LDLCALC   Current Medications: Current Outpatient Prescriptions  Medication Sig Dispense Refill  . acetaminophen (TYLENOL) 100 MG/ML solution Take by mouth every 4 (four) hours as needed. Reported on 04/15/2016    . Albuterol Sulfate (PROAIR HFA IN) Inhale into the lungs.      Marland Kitchen allopurinol (ZYLOPRIM) 300 MG tablet Take 300 mg by mouth daily. As needed  5  . cholecalciferol  (VITAMIN D) 1000 units tablet Take 1,000 Units by mouth daily.    . colchicine 0.6 MG tablet TAKE 2 TABLETS BY MOUTH ONCE THEN 1 TABLET ONE HOUR LATER MAY REPEAT NEXT DAY IF NEEDED  1  . fluticasone (FLONASE) 50 MCG/ACT nasal spray INSTILL 2 SPRAYS IN NOSTRIL(S) EVERY MORNING  12  . gabapentin (NEURONTIN) 400 MG capsule Take 300 mg by mouth 3 (three) times daily.   5  . ibuprofen (ADVIL,MOTRIN) 800 MG tablet Take 800 mg by mouth every 8 (eight) hours as needed.    . loratadine (CLARITIN) 10 MG tablet Take 10 mg by mouth daily. Reported on 04/15/2016    . pantoprazole (PROTONIX) 40 MG tablet Take 40 mg by mouth daily. Reported on 04/15/2016    . PROAIR HFA 108 (90 Base) MCG/ACT inhaler INHALE 2 PUFFS INTO LUNGS FOUR TIMES DAILY  12  . traMADol (ULTRAM) 50 MG tablet TAKE 1 TABLET AS NEEDED EVERY 6 HOURS  0  . traZODone (DESYREL) 100 MG tablet Take 1 tablet (100 mg total) by mouth at bedtime. 30 tablet 2  . FLUoxetine (PROZAC) 20 MG capsule Take 1 capsule (20 mg total) by mouth daily. 30 capsule 2   No current facility-administered medications for this visit.     Neurologic: Headache: Yes Seizure: No Paresthesias:Yes  Musculoskeletal: Strength & Muscle Tone: decreased Gait & Station: unsteady Patient leans: N/A  Psychiatric Specialty Exam: Review of Systems  Constitutional: Positive for malaise/fatigue.  HENT: Positive for congestion.   Genitourinary: Positive for frequency.  Musculoskeletal: Positive for back pain, joint pain and neck pain.  Neurological: Positive for tingling, sensory change and focal weakness.  Psychiatric/Behavioral: Positive for depression. The patient is nervous/anxious and has insomnia.     Blood pressure (!) 155/87, pulse 92, height 5\' 7"  (1.702 m), weight 175 lb 9.6 oz (79.7 kg).Body mass index is 27.5 kg/m.  General Appearance: Casual and Fairly Groomed  Eye Contact:  Good  Speech:  Clear and Coherent  Volume:  Normal   Mood:AnxiousAnd depressed    Affect:Constricted   Thought Process:  Coherent  Orientation:  Full (Time, Place, and Person)  Thought Content:  Rumination,  Suicidal Thoughts:  No but had passive suicidal thoughts in the past week   Homicidal Thoughts:  No  Memory:  Immediate;   Good Recent;   Fair Remote;   Fair  Judgement:  Fair  Insight:  Fair  Psychomotor Activity:  Restlessness  Concentration:  Concentration: Fair and Attention Span: Fair  Recall:  [employer] of Knowledge:Fair  Language: Good  Akathisia:  No  Handed:  Right  AIMS (if indicated):    Assets:  Communication Skills Desire for Improvement Resilience Social Support  ADL's:  Intact  Cognition: WNL  Sleep:  poor    Treatment Plan Summary: Medication management   Patient will continue trazodone  100 mg daily at bedtime. He will discontinue Cymbalta and start Prozac 20 mg daily. He'll return in 3 weeks but can call at any time prior to that of his depression worsens or go to the ED or call 911   Diannia Ruder, MD 5/4/20189:59 AM

## 2017-03-12 ENCOUNTER — Ambulatory Visit
Admission: RE | Admit: 2017-03-12 | Discharge: 2017-03-12 | Disposition: A | Discharge status: Home / Self Care | Payer: Medicaid Other | Source: Ambulatory Visit | Attending: Nurse Practitioner | Admitting: Nurse Practitioner

## 2017-03-12 ENCOUNTER — Encounter: Payer: Self-pay | Admitting: Diagnostic Neuroimaging

## 2017-03-12 DIAGNOSIS — M5416 Radiculopathy, lumbar region: Secondary | ICD-10-CM

## 2017-03-12 MED ORDER — METHYLPREDNISOLONE ACETATE 40 MG/ML INJ SUSP (RADIOLOG
120.0000 mg | Freq: Once | INTRAMUSCULAR | Status: AC
  Administered 2017-03-12: 120 mg via EPIDURAL

## 2017-03-12 MED ORDER — IOPAMIDOL (ISOVUE-M 200) INJECTION 41%
1.0000 mL | Freq: Once | INTRAMUSCULAR | Status: AC
  Administered 2017-03-12: 1 mL via EPIDURAL

## 2017-03-12 NOTE — Discharge Instructions (Signed)

## 2017-03-27 ENCOUNTER — Encounter (HOSPITAL_COMMUNITY): Payer: Self-pay | Admitting: Psychiatry

## 2017-03-27 ENCOUNTER — Ambulatory Visit (INDEPENDENT_AMBULATORY_CARE_PROVIDER_SITE_OTHER): Payer: Medicaid Other | Admitting: Psychiatry

## 2017-03-27 VITALS — BP 131/93 | HR 85 | Ht 67.0 in | Wt 174.0 lb

## 2017-03-27 DIAGNOSIS — Z813 Family history of other psychoactive substance abuse and dependence: Secondary | ICD-10-CM | POA: Diagnosis not present

## 2017-03-27 DIAGNOSIS — Z888 Allergy status to other drugs, medicaments and biological substances status: Secondary | ICD-10-CM

## 2017-03-27 DIAGNOSIS — Z886 Allergy status to analgesic agent status: Secondary | ICD-10-CM

## 2017-03-27 DIAGNOSIS — Z791 Long term (current) use of non-steroidal anti-inflammatories (NSAID): Secondary | ICD-10-CM

## 2017-03-27 DIAGNOSIS — Z79899 Other long term (current) drug therapy: Secondary | ICD-10-CM

## 2017-03-27 DIAGNOSIS — Z818 Family history of other mental and behavioral disorders: Secondary | ICD-10-CM

## 2017-03-27 DIAGNOSIS — F329 Major depressive disorder, single episode, unspecified: Secondary | ICD-10-CM | POA: Diagnosis not present

## 2017-03-27 DIAGNOSIS — Z7951 Long term (current) use of inhaled steroids: Secondary | ICD-10-CM

## 2017-03-27 DIAGNOSIS — Z881 Allergy status to other antibiotic agents status: Secondary | ICD-10-CM

## 2017-03-27 DIAGNOSIS — F419 Anxiety disorder, unspecified: Secondary | ICD-10-CM

## 2017-03-27 DIAGNOSIS — F332 Major depressive disorder, recurrent severe without psychotic features: Secondary | ICD-10-CM

## 2017-03-27 DIAGNOSIS — F17210 Nicotine dependence, cigarettes, uncomplicated: Secondary | ICD-10-CM

## 2017-03-27 MED ORDER — FLUOXETINE HCL 20 MG PO CAPS: 20.0000 mg | ORAL_CAPSULE | Freq: Every day | ORAL | 2 refills | Status: DC

## 2017-03-27 MED ORDER — TRAZODONE HCL 100 MG PO TABS: 100.0000 mg | ORAL_TABLET | Freq: Every day | ORAL | 2 refills | Status: DC

## 2017-03-27 NOTE — Progress Notes (Signed)
Psychiatric Initial Adult Assessment   Patient Identification: Cody Collins MRN:  [id number] Date of Evaluation:  03/27/2017 Referral Source: Dr. Dimas Aguas, [facility] Chief Complaint:   Chief Complaint    Depression; Anxiety; Follow-up     Visit Diagnosis:    ICD-9-CM ICD-10-CM   1. Severe episode of recurrent major depressive disorder, without psychotic features (HCC) 296.33 F33.2     History of Present Illness:  This patient is a 56 year old divorced white male who lives with his daughter her husband and their 2 children in [place]. 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  The patient was referred by Dr. Dimas Aguas for further treatment and assessment of depression and anxiety.  The patient states that he has had no previous mental health treatment per se. He is been depressed on and off for years. Much of this started a few years ago when he began treatment for hepatitis C and interferon made him feel sad and depressed. He's been primarily on Zoloft. Lately however he feels like his life is falling apart because of health issues. He states that he is always been "strong and able to work.". 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 He's had to quit work over the last year due to severe arthritis in his knees and legs. Last year he also had to have cervical disc surgery which is now failed and he is having severe numbness and pain down his left leg. His primary care physician tried to start him on Cymbalta but at the time he had no insurance and no way to pay for. He has run out of funds and has to stay with his daughter which is bothering him as well.  The patient has a long history of substance abuse. 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 He used it on and off for a number of years but stopped after he had his children. He has twins that are 21 and a daughter that is 67. His wife was using far more than  he was and he ended up gaining custody of the children when they were very young and he raised them. He had an affair with her babysitter later and she became pregnant and had a boy which she did not find out about until the child was 61 years old.  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 Unfortunately after that he began using methamphetamine and marijuana and alcohol again. He states that he was drinking fairly heavily until about 2 weeks ago but now he is down to 2-3 beers a day from 5 or 6 a day. He is no longer using any drugs.  Currently the patient states that his mood is somewhat depressed despite being on Zoloft 100 mg daily. He feels sad and overwhelmed. He can't sleep often due to pain. His energy is poor and he is irritable and grumpy. He is often nervous and anxious but denies panic attacks. He has been avoiding people. He's never been suicidal or homicidal. Sometimes he has obsessional repetitive thoughts. He laughs inappropriately all the time he states he can help himself as as it is his way of relieving stress  The patient returns after 3 weeks . Last time he was very depressed and we changed him from Cymbalta to Prozac. Unfortunately, he misunderstood and is  taking both. I explained that he is only supposed to be taking the Prozac. Nevertheless he is feeling better. One of his daughters came to visit from [place]. He had a great visit and she talked to the daughter that he is living with and the daughter that he is living with his now nicer to him. He still planning to move to [place] if he can get all of his disability and Medicaid things arranged here. He states that sometimes he has fleeting suicidal thoughts but he would never act on them. He is sleeping well with the trazodone and he is no longer drinking  Associated Signs/Symptoms: Depression Symptoms:  depressed  mood, anhedonia, insomnia, fatigue, feelings of worthlessness/guilt, anxiety, loss of energy/fatigue, disturbed sleep, (Hypo) Manic Symptoms:  Irritable Mood, Anxiety Symptoms:  Excessive Worry,   Past Psychiatric History: None, other than treatment by primary care  Previous Psychotropic Medications: Yes   Substance Abuse History in the last 12 months:  Yes.    Consequences of Substance Abuse: Medical Consequences:  History of hepatitis C Legal Consequences:  History of one DUI  Past Medical History:  Past Medical History:  Diagnosis Date  . Arthritis   . Degenerative disc disease, cervical   . Diarrhea   . Electrocution 1996   from faulty wiring at pool  . Headache(784.0)    migraines  . Hepatitis HEP C   07/22/16 went through tx 3 times, clear at this time  . Stroke (cerebrum) (HCC) 1997  . Vitamin D deficiency     Past Surgical History:  Procedure Laterality Date  . CERVICAL DISC SURGERY  08/2015   fusion, pinched nerves  . COLONOSCOPY  03/11/2010  . ESOPHAGOGASTRODUODENOSCOPY  03/11/2010  . KNEE SURGERY Bilateral    2 surgeries on L, one on R- arthroscopic   . LIVER BIOPSY  05/20/2010    Family Psychiatric History: The patient's father was a heavy drug user as well. The mother has a history of depression  Family History:  Family History  Problem Relation Age of Onset  . Ovarian cancer Sister   . Ovarian cancer Sister   . Multiple sclerosis Sister   . Healthy Sister   . Pneumonia Brother   . Down syndrome Brother   . Healthy Daughter   . Depression Mother   . Parkinson's disease Mother   . Drug abuse Father   . Healthy Paternal Grandfather     Social History:   Social History   Social History  . Marital status: Single    Spouse name: N/A  . Number of children: 4  . Years of education: 6611   Occupational History  .      disabled   Social History Main Topics  . Smoking status: Current Every Day Smoker    Packs/day: 1.00    Types:  Cigarettes  . Smokeless tobacco: Never Used     Comment: Vapor, 07/22/16 smoking 10-15 cigs daily  . Alcohol use Yes     Comment: 04-15-2016 per pt 2-3 beer per day, 07/22/16 1-2  beers daily  . Drug use: No     Comment: 04-15-2016 per pt no. Per pt it's been years since using Meth   . Sexual activity: Not Currently   Other Topics Concern  . None   Social History Narrative   Lives with daughter    caffeine use- coffee- 3 cups daily    Additional Social History: The patient grew up in [place] with his mother and 5 siblings. His  father really was not part of the family. He never finished high school but left for [place] with his girlfriend at age 70. As noted above he used drugs heavily for a number of years. 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 He has been married once and divorced when his children were very young. He had another child with a babysitter whom he found out about years later. He is currently unemployed and applying for disability  Allergies:   Allergies  Allergen Reactions  . Ciprofloxacin Hcl Swelling    Per pt Throat Swells   . Codeine Itching  . Vitamin C [Ascorbic Acid] Other (See Comments)    Patient states that this causes problems with his ears.    Metabolic Disorder Labs: No results found for: HGBA1C, MPG No results found for: PROLACTIN No results found for: CHOL, TRIG, HDL, CHOLHDL, VLDL, LDLCALC   Current Medications: Current Outpatient Prescriptions  Medication Sig Dispense Refill  . acetaminophen (TYLENOL) 100 MG/ML solution Take by mouth every 4 (four) hours as needed. Reported on 04/15/2016    . Albuterol Sulfate (PROAIR HFA IN) Inhale into the lungs.      Marland Kitchen allopurinol (ZYLOPRIM) 300 MG tablet Take 300 mg by mouth daily. As needed  5  . cholecalciferol (VITAMIN D) 1000 units tablet Take 1,000 Units by mouth daily.    . colchicine 0.6 MG tablet TAKE 2 TABLETS BY MOUTH ONCE THEN 1 TABLET ONE HOUR LATER MAY REPEAT NEXT DAY IF NEEDED  1  .  FLUoxetine (PROZAC) 20 MG capsule Take 1 capsule (20 mg total) by mouth daily. 30 capsule 2  . fluticasone (FLONASE) 50 MCG/ACT nasal spray INSTILL 2 SPRAYS IN NOSTRIL(S) EVERY MORNING  12  . gabapentin (NEURONTIN) 400 MG capsule Take 300 mg by mouth 3 (three) times daily.   5  . ibuprofen (ADVIL,MOTRIN) 800 MG tablet Take 800 mg by mouth every 8 (eight) hours as needed.    . loratadine (CLARITIN) 10 MG tablet Take 10 mg by mouth daily. Reported on 04/15/2016    . pantoprazole (PROTONIX) 40 MG tablet Take 40 mg by mouth daily. Reported on 04/15/2016    . PROAIR HFA 108 (90 Base) MCG/ACT inhaler INHALE 2 PUFFS INTO LUNGS FOUR TIMES DAILY  12  . traMADol (ULTRAM) 50 MG tablet TAKE 1 TABLET AS NEEDED EVERY 6 HOURS  0  . traZODone (DESYREL) 100 MG tablet Take 1 tablet (100 mg total) by mouth at bedtime. 30 tablet 2   No current facility-administered medications for this visit.     Neurologic: Headache: Yes Seizure: No Paresthesias:Yes  Musculoskeletal: Strength & Muscle Tone: decreased Gait & Station: unsteady Patient leans: N/A  Psychiatric Specialty Exam: Review of Systems  Constitutional: Positive for malaise/fatigue.  HENT: Positive for congestion.   Genitourinary: Positive for frequency.  Musculoskeletal: Positive for back pain, joint pain and neck pain.  Neurological: Positive for tingling, sensory change and focal weakness.  Psychiatric/Behavioral: Positive for depression. The patient is nervous/anxious and has insomnia.     Blood pressure (!) 131/93, pulse 85, height 5\' 7"  (1.702 m), weight 174 lb (78.9 kg).Body mass index is 27.25 kg/m.  General Appearance: Casual and Fairly Groomed  Eye Contact:  Good  Speech:  Clear and Coherent  Volume:  Normal   Mood:Anxious  Affect:Brighter   Thought Process:  Coherent  Orientation:  Full (Time, Place, and Person)  Thought Content:  Rumination,  Suicidal Thoughts:  No   Homicidal Thoughts:  No  Memory:  Immediate;   Good Recent;    Fair Remote;   Fair  Judgement:  Fair  Insight:  Fair  Psychomotor Activity:  Restlessness  Concentration:  Concentration: Fair and Attention Span: Fair  Recall:  [employer] of Knowledge:Fair  Language: Good  Akathisia:  No  Handed:  Right  AIMS (if indicated):    Assets:  Communication Skills Desire for Improvement Resilience Social Support  ADL's:  Intact  Cognition: WNL  Sleep:  poor    Treatment Plan Summary: Medication management   Patient will continue trazodone  100 mg daily at bedtime. He will Continue Prozac 20 mg daily. He'll return in 4 weeks but can call at any time prior to that of his depression worsens or go to the ED or call 911. He will start therapy here   Diannia Ruder, MD 5/25/20189:24 AM

## 2017-04-02 ENCOUNTER — Ambulatory Visit (INDEPENDENT_AMBULATORY_CARE_PROVIDER_SITE_OTHER): Payer: Medicaid Other | Admitting: Licensed Clinical Social Worker

## 2017-04-02 ENCOUNTER — Encounter (HOSPITAL_COMMUNITY): Payer: Self-pay | Admitting: Licensed Clinical Social Worker

## 2017-04-02 DIAGNOSIS — F332 Major depressive disorder, recurrent severe without psychotic features: Secondary | ICD-10-CM | POA: Diagnosis not present
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
## 2017-04-16 ENCOUNTER — Ambulatory Visit (INDEPENDENT_AMBULATORY_CARE_PROVIDER_SITE_OTHER): Payer: Medicaid Other | Admitting: Licensed Clinical Social Worker

## 2017-04-16 ENCOUNTER — Encounter (HOSPITAL_COMMUNITY): Payer: Self-pay | Admitting: Licensed Clinical Social Worker

## 2017-04-16 ENCOUNTER — Telehealth (HOSPITAL_COMMUNITY): Payer: Self-pay | Admitting: *Deleted

## 2017-04-16 DIAGNOSIS — F422 Mixed obsessional thoughts and acts: Secondary | ICD-10-CM | POA: Diagnosis not present

## 2017-04-16 DIAGNOSIS — F332 Major depressive disorder, recurrent severe without psychotic features: Secondary | ICD-10-CM

## 2017-04-16 NOTE — Progress Notes (Signed)
   THERAPIST PROGRESS NOTE  Session Time: 10:05 am -11:05 am  Participation Level: Active  Behavioral Response: Casual Alert Anxious  Type of Therapy: Individual Therapy  Treatment Goals addressed: Anxiety and Diagnosis: Major depressive disorder, recurrent episode, severe with anxious distress  Interventions: CBT and Solution Focused  Summary: Cody Collins is a 56 y.o. male who presents oriented x5 (person, place, situation, time, and object). Patient has a history of medical treatment including neck surgery and mental health treatment including outpatient therapy and medication management. Patient denies psychosis including auditory and visual hallucinations. Patient denies homicidal ideations but admits to passive thoughts of suicide ("It would be better if I weren't here"). 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   Patient had an average score of 2.25 on the Outcome Rating Scale. Patient reported that "things are still the same." Patient collaborated with therapist to set a goal for treatment and complete the treatment plan. Patient identified 3 areas of his life that he would like to set small goals and work on including leisure/time spent having fun, purpose in life, and environment/physical surroundings. Patient set small goals in each area to start working on immediately. Patient engaged in session. He responded well to interventions. Patient continues to meet criteria for Major Depressive Disorder, recurrent, severe with anxious distress. He will continue in outpatient therapy due to being the least restrictive service to meet his needs at this time. Patient made no progress on his goals. Patient rated the session at 6 out of 10 on the session rating scale. He noted he rated it at a 6 due to still gathering information and could not identify any changes he would like during the session.   Suicidal/Homicidal:  No  Therapist Response: Therapist reviewed patients recent thoughts and behaviors. Therapist utilized CBT to address depression and anxiety. Therapist collaborated with patient to set a goal for treatment and complete the treatment plan. Therapist assisted patient in identifying 3 areas of his life to work on and set small goals in. Therapist committed client to work on the small goals that he set. Therapist administered the Outcome Rating Scale and the Session rating scale.   Plan: Return again in 1 weeks.  Diagnosis: Axis I: Major Depression, Recurrent severe and Mixed obsessional thoughts and acts    Axis II: No diagnosis    Cody BellowsJoshua Junelle Hashemi, LCSW 04/16/2017

## 2017-04-16 NOTE — Telephone Encounter (Signed)
left voice message, provider out of office 04/29/17. 

## 2017-04-22 ENCOUNTER — Ambulatory Visit: Payer: Self-pay | Admitting: Diagnostic Neuroimaging

## 2017-04-23 ENCOUNTER — Ambulatory Visit (INDEPENDENT_AMBULATORY_CARE_PROVIDER_SITE_OTHER): Payer: Medicaid Other | Admitting: Psychiatry

## 2017-04-23 ENCOUNTER — Encounter (HOSPITAL_COMMUNITY): Payer: Self-pay | Admitting: Psychiatry

## 2017-04-23 VITALS — BP 137/90 | HR 95 | Ht 67.0 in | Wt 177.2 lb

## 2017-04-23 DIAGNOSIS — Z818 Family history of other mental and behavioral disorders: Secondary | ICD-10-CM

## 2017-04-23 DIAGNOSIS — F1099 Alcohol use, unspecified with unspecified alcohol-induced disorder: Secondary | ICD-10-CM | POA: Diagnosis not present

## 2017-04-23 DIAGNOSIS — Z813 Family history of other psychoactive substance abuse and dependence: Secondary | ICD-10-CM

## 2017-04-23 DIAGNOSIS — F332 Major depressive disorder, recurrent severe without psychotic features: Secondary | ICD-10-CM

## 2017-04-23 DIAGNOSIS — F17210 Nicotine dependence, cigarettes, uncomplicated: Secondary | ICD-10-CM | POA: Diagnosis not present

## 2017-04-23 MED ORDER — FLUOXETINE HCL 20 MG PO CAPS: 20.0000 mg | ORAL_CAPSULE | Freq: Two times a day (BID) | ORAL | 2 refills | Status: DC

## 2017-04-23 MED ORDER — TRAZODONE HCL 150 MG PO TABS: 150.0000 mg | ORAL_TABLET | Freq: Every day | ORAL | 2 refills | Status: DC
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
## 2017-04-24 ENCOUNTER — Ambulatory Visit (INDEPENDENT_AMBULATORY_CARE_PROVIDER_SITE_OTHER): Payer: Medicaid Other | Admitting: Licensed Clinical Social Worker

## 2017-04-24 ENCOUNTER — Ambulatory Visit (HOSPITAL_COMMUNITY): Payer: Self-pay | Admitting: Psychiatry

## 2017-04-24 ENCOUNTER — Encounter (HOSPITAL_COMMUNITY): Payer: Self-pay | Admitting: Licensed Clinical Social Worker

## 2017-04-24 DIAGNOSIS — F332 Major depressive disorder, recurrent severe without psychotic features: Secondary | ICD-10-CM | POA: Diagnosis not present

## 2017-04-24 DIAGNOSIS — F422 Mixed obsessional thoughts and acts: Secondary | ICD-10-CM | POA: Diagnosis not present

## 2017-04-24 NOTE — Progress Notes (Signed)
THERAPIST PROGRESS NOTE  Session Time: 10:55 am -12:00 pm  Participation Level: Active  Behavioral Response: Casual Alert Anxious  Type of Therapy: Individual Therapy  Treatment Goals addressed: Anxiety and Diagnosis: Major depressive disorder, recurrent episode, severe with anxious distress  Interventions: CBT and Solution Focused  Summary: Cody Collins is a 56 y.o. male who presents oriented x5 (person, place, situation, time, and object). Patient has a history of medical treatment including neck surgery and mental health treatment including outpatient therapy and medication management. Patient denies psychosis including auditory and visual hallucinations. Patient denies homicidal ideations but admits to passive thoughts of suicide ("It would be better if I weren't here"). 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   Patient had an average score of 1 out of ten on the Outcome Rating Scale. Patient went down 1.25 on the Outcome Rating Scale. Patient reported that he has been doing his home work of getting outside the home, not sleeping in his car, and walking his dog. He reported that these things help him feel some happiness for a short time. Patient reported that the stress level in the home has increased. He feels like his daughter and son in law argue a lot, treat him like a burden and don't clean up their home. Patient discussed his Father's Day and how he was not acknowledged by his daughter but that his daughter and son in law bought steaks to cook for the son in laws parents. Patient feels "stuck" in his current circumstances because he is waiting for disability. Patient also noted that he is having obsessional thoughts. Patient noted that if he has a negative thought while reading, he has to re-read the same paragraph until he has a positive thought. Patient has been experiencing a variation of this behavior since  childhood. He noted that he used to not be able to say the word death because he felt that someone would die or something bad would happy. Patient reported that when Cody Collins passed away the news interrupted his tv program reporting that she was in a wreck and this frustrated him. He explained that out of frustration he said that he wished she would hurry up and die, then the news broke that she passed away and it "Freaked me out." Patient agreed to allow one thought pass without repeating a behavior.   Patient engaged in session. He responded well to interventions. Patient continues to meet criteria for Major Depressive Disorder, recurrent, severe with anxious distress and Mixed Obsessional Thoughts and acts. He will continue in outpatient therapy due to being the least restrictive service to meet his needs at this time. Patient made minimal progress on his goals. Patient rated the session at 7.75 out of 10 on the session rating scale which is an increase of 1.75.  Suicidal/Homicidal: No  Therapist Response: Therapist reviewed patients recent thoughts and behaviors. Therapist utilized CBT to address depression and anxiety. Therapist followed up on patient's homework. Therapist processed the stress level in his home to identify triggers. Therapist discussed patient's obessional thoughts and behaviors, how long he has been experiencing them and examples. Therapist challenged patients cognitions by explaining that he doesn't have the "power" to control whether something bad does or does not happy by thinking something negative. Therapist committed patient to allow one negative thought to occur without repeating a behavior.  Therapist administered the Outcome Rating Scale and the Session rating scale.   Plan:  Return again in 1 weeks.  Diagnosis: Axis I: Major Depression, Recurrent severe and Mixed obsessional thoughts and acts    Axis II: No diagnosis    Bynum BellowsJoshua Latifah Padin, LCSW 04/24/2017

## 2017-04-28 ENCOUNTER — Telehealth (HOSPITAL_COMMUNITY): Payer: Self-pay | Admitting: *Deleted

## 2017-04-28 NOTE — Telephone Encounter (Signed)
returned phone call to schedule appointment. 

## 2017-04-29 ENCOUNTER — Ambulatory Visit (HOSPITAL_COMMUNITY): Payer: Self-pay | Admitting: Psychiatry

## 2017-05-01 ENCOUNTER — Encounter (HOSPITAL_COMMUNITY): Payer: Self-pay | Admitting: Licensed Clinical Social Worker

## 2017-05-01 ENCOUNTER — Ambulatory Visit (INDEPENDENT_AMBULATORY_CARE_PROVIDER_SITE_OTHER): Payer: Medicaid Other | Admitting: Licensed Clinical Social Worker

## 2017-05-01 DIAGNOSIS — F422 Mixed obsessional thoughts and acts: Secondary | ICD-10-CM

## 2017-05-01 NOTE — Progress Notes (Signed)
   THERAPIST PROGRESS NOTE  Session Time: 11:00 am -12:00 pm  Participation Level: Active  Behavioral Response: Casual Alert Anxious  Type of Therapy: Individual Therapy  Treatment Goals addressed: Anxiety and Diagnosis: Major depressive disorder, recurrent episode, severe with anxious distress  Interventions: CBT and Solution Focused  Summary: Sherril CongMichael E Corp is a 56 y.o. male who presents oriented x5 (person, place, situation, time, and object). Patient has a history of medical treatment including neck surgery and mental health treatment including outpatient therapy and medication management. Patient denies psychosis including auditory and visual hallucinations. Patient denies homicidal ideations but admits to passive thoughts of suicide ("It would be better if I weren't here"). 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   Patient had an average score of 1.5  out of ten on the Outcome Rating Scale which is an improvement of .5 from the last session.. Patient went down 1.25 on the Outcome Rating Scale. Patient reported that things have not changed at home. He reports that his daughter, son in law and grandson don't speak to him. Patient reported that his family is going out of town this weekend and didn't know about it until Wednesday. Patient reported that he did his homework and allowed a negative thought to pass without repeating a behavior such as going back through a door or reading a paragraph again. Patient reports that he was able to go 15 to 20 minutes after he went through his front door but he developed a migraine and started to shake. Patient then explained that he tried again while reading his book. Patient reported the read a few paragraphs and then had to start over. After discussion, patient understood some concepts of mindfulness and breathing to stay centered. Patient noted that music helps him relax as well as being  with his dog. Patient committed to work on distress tolerance by allowing a thought to pass without repeating a behavior immediately and using deep breathing, music and petting his dog to get through 10 minutes before repeating a behavior. Patient rated the session at 6.75 out of 10 on the session rating scale which is a decrease of 1.0. Patient stated he was satisfied with the session and wouldn't change anything.   Patient engaged in session. He responded well to interventions. Patient continues to meet criteria for Major Depressive Disorder, recurrent, severe with anxious distress and Mixed Obsessional Thoughts and acts. He will continue in outpatient therapy due to being the least restrictive service to meet his needs at this time. Patient made minimal progress on his goals.   Suicidal/Homicidal: No  Therapist Response: Therapist reviewed patients recent thoughts and behaviors. Therapist utilized CBT to address depression and anxiety. Therapist followed up on patient's homework.  Therapist discussed what thoughts and physical reactions patient had when completing his homework. Therapist educated patient on mindful breathing and distress tolerance. Therapist had patient identify other ways to relax. Therapist committed patient to allow one negative thought to occur without repeating a behavior for ten minutes and developing distress tolerance by using deep breathing, music and petting his dog.  Therapist administered the Outcome Rating Scale and the Session rating scale.   Plan: Return again in 1 weeks.  Diagnosis: Axis I: Major Depression, Recurrent severe and Mixed obsessional thoughts and acts    Axis II: No diagnosis    Bynum BellowsJoshua Rmani Kapusta, LCSW 05/01/2017

## 2017-05-11 ENCOUNTER — Ambulatory Visit (INDEPENDENT_AMBULATORY_CARE_PROVIDER_SITE_OTHER): Payer: Medicaid Other | Admitting: Licensed Clinical Social Worker

## 2017-05-11 ENCOUNTER — Encounter (HOSPITAL_COMMUNITY): Payer: Self-pay | Admitting: Licensed Clinical Social Worker

## 2017-05-11 DIAGNOSIS — F332 Major depressive disorder, recurrent severe without psychotic features: Secondary | ICD-10-CM

## 2017-05-11 NOTE — Progress Notes (Signed)
   THERAPIST PROGRESS NOTE  Session Time: 10:00 am -11:00 am  Participation Level: Active  Behavioral Response: Casual Alert Anxious  Type of Therapy: Individual Therapy  Treatment Goals addressed: Anxiety and Diagnosis: Major depressive disorder, recurrent episode, severe with anxious distress  Interventions: CBT and Solution Focused  Summary: Cody Collins is a 56 y.o. male who presents oriented x5 (person, place, situation, time, and object). Patient has a history of medical treatment including neck surgery and mental health treatment including outpatient therapy and medication management. Patient denies psychosis including auditory and visual hallucinations. Patient denies homicidal ideations but admits to passive thoughts of suicide ("It would be better if I weren't here"). 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   Patient had an average score of 1.75  out of ten on the Outcome Rating Scale which is an improvement of .25 from the last session. Patient explained that he has had a 'roller coaster" of a week. Patient completed his homework to avoid immediately repeating a behavior if a negative thought comes in and to wait 10 minutes before repeating the behavior.  Patient described a situation in which he communicated with a lady he dated when they were teenagers. Patient said that he said he "cherished" their friendship and then she quit responding. Patient was worried that he offended her, upset her husband or felt like she thought he was trying to make a move on her. Patient focused on this situation and it caused him not only anxiety but depression as well. Patient reported that he has been able to control his OCD behaviors throughout his life (work, raising his children, etc) because he had a "mission" and had to accomplish his tasks. Patient understood that he can take this same approach to managing his stress but also that  he is a different person than who he was previously (older, disabled, health issues) and needs to take that into consideration to avoid beating himself up. Patient agreed to work on distress tolerance daily for 10 minutes between today and the next session. Patient rated the session at 6.00 out of 10 on the session rating scale which is a decrease of .75. Patient stated he was satisfied with the session and provided no suggestions for improvements.   Patient engaged in session. He responded well to interventions. Patient continues to meet criteria for Major Depressive Disorder, recurrent, severe with anxious distress and Mixed Obsessional Thoughts and acts. He will continue in outpatient therapy due to being the least restrictive service to meet his needs at this time. Patient made minimal progress on his goals.   Suicidal/Homicidal: No  Therapist Response: Therapist reviewed patients recent thoughts and behaviors. Therapist utilized CBT to address depression and anxiety. Therapist followed up on patient's homework.  Therapist processed patient's feelings to identify triggers for anxiety. Therapist had patient identify times when he doesn't struggle with OCD behaviors. Therapist committed patient to improve distress tolerance daily for 10 minutes a day.  Therapist administered the Outcome Rating Scale and the Session rating scale.   Plan: Return again in 1 weeks.  Diagnosis: Axis I: Major Depression, Recurrent severe and Mixed obsessional thoughts and acts    Axis II: No diagnosis    Bynum BellowsJoshua Macgregor Aeschliman, LCSW 05/11/2017

## 2017-05-20 ENCOUNTER — Ambulatory Visit (INDEPENDENT_AMBULATORY_CARE_PROVIDER_SITE_OTHER): Payer: Medicaid Other | Admitting: Licensed Clinical Social Worker

## 2017-05-20 ENCOUNTER — Encounter (HOSPITAL_COMMUNITY): Payer: Self-pay | Admitting: Licensed Clinical Social Worker

## 2017-05-20 DIAGNOSIS — F422 Mixed obsessional thoughts and acts: Secondary | ICD-10-CM

## 2017-05-20 NOTE — Progress Notes (Signed)
   THERAPIST PROGRESS NOTE  Session Time: 10:00 am -10:45 am  Participation Level: Active  Behavioral Response: Casual Alert Anxious  Type of Therapy: Individual Therapy  Treatment Goals addressed: Anxiety and Diagnosis: Major depressive disorder, recurrent episode, severe with anxious distress  Interventions: CBT and Solution Focused  Summary: Cody Collins is a 56 y.o. male who presents oriented x5 (person, place, situation, time, and object). Patient has a history of medical treatment including neck surgery and mental health treatment including outpatient therapy and medication management. Patient denies psychosis including auditory and visual hallucinations. Patient denies homicidal ideations but admits to passive thoughts of suicide ("It would be better if I weren't here"). 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   Patient had an average score of 1.50  out of 10 on the Outcome Rating Scale which is an decrease of .25 from the last session. Patient reported that he completed his homework. Patient reported that he is no longer shaking but still feels clamy during his distress tolerance practice. After discussion, patient recognized this as progress. Patient asked for help with going in public and understood that the same approach he is taking to manage anxiety would be beneficial to help with going in public. Patient understood that he needs to expose himself to the source of anxiety, breath deep, and focus on his reason for being in the store. Patient shared experiences where he has been successful managing his social anxiety. Patient agreed to work on distress tolerance 5 days a week for 12 minutes between today and the next session. Patient rated the session at 5.00 out of 10 on the session rating scale which is a decrease of 1. Patient stated he was satisfied with the session and provided no suggestions for improvements.   Patient engaged in session. He responded well to interventions. Patient continues to meet criteria for Major Depressive Disorder, recurrent, severe with anxious distress and Mixed Obsessional Thoughts and acts. He will continue in outpatient therapy due to being the least restrictive service to meet his needs at this time. Patient made minimal progress on his goals.   Suicidal/Homicidal: No  Therapist Response: Therapist reviewed patients recent thoughts and behaviors. Therapist utilized CBT to address depression and anxiety. Therapist followed up on patient's homework.  Therapist discussed patient's distress tolerance and praised patient for his improvements. Therapist had patient identify times when he has managed his social anxiety. Therapist committed patient to improve distress tolerance 5 days a week for 12 minutes a day.  Therapist administered the Outcome Rating Scale and the Session rating scale.   Plan: Return again in 1 weeks.  Diagnosis: Axis I: Major Depression, Recurrent severe and Mixed obsessional thoughts and acts    Axis II: No diagnosis    Bynum BellowsJoshua Teal Bontrager, LCSW 05/20/2017

## 2017-05-27 ENCOUNTER — Encounter (HOSPITAL_COMMUNITY): Payer: Self-pay | Admitting: Licensed Clinical Social Worker

## 2017-05-27 ENCOUNTER — Ambulatory Visit (INDEPENDENT_AMBULATORY_CARE_PROVIDER_SITE_OTHER): Payer: Medicaid Other | Admitting: Licensed Clinical Social Worker

## 2017-05-27 DIAGNOSIS — F422 Mixed obsessional thoughts and acts: Secondary | ICD-10-CM | POA: Diagnosis not present

## 2017-05-27 NOTE — Progress Notes (Signed)
   THERAPIST PROGRESS NOTE  Session Time: 10:00 am -10:45 am  Participation Level: Active  Behavioral Response: Casual Alert Anxious  Type of Therapy: Individual Therapy  Treatment Goals addressed: Anxiety and Diagnosis: Major depressive disorder, recurrent episode, severe with anxious distress  Interventions: CBT and Solution Focused  Summary: Cody Collins is a 56 y.o. male who presents oriented x5 (person, place, situation, time, and object). Patient has a history of medical treatment including neck surgery and mental health treatment including outpatient therapy and medication management. Patient denies psychosis including auditory and visual hallucinations. Patient denies homicidal ideations but admits to passive thoughts of suicide ("It would be better if I weren't here"). 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   Patient had an average score of 1.50  out of 10 on the Outcome Rating Scale which is the same from the last session. Patient explained that his repeating behaviors are improving. He is experiencing more comfort in not repeating behaviors and is building up distress tolerance. Patient reports that his home environment continues to be stressful with his son in law not talking to him and his daughter ignoring him as well as getting upset if he mentions anything to her about a problem in the home. Patient also noted that people are taking credit for the housework he does in the home and don't thank him for cleaning up. Patient noted that his daughter he lives with has not taken him to get the title to the car he is driving and he needs that to be able to move to [place] in Sept. He feels like she is not going to help him get the title changed to his name rather than her in-laws name. Patient repaired a car that is in their name that was no longer working. Patient feels like the scapegoat in the home. He recognizes his home  environment adds to his repetitive thoughts and behaviors because of the negative thoughts he has. Patient agreed to work on distress tolerance 5 days a week for 15 minutes between today and the next session. Patient rated the session at 6.25 out of 10 on the session rating scale which is a increase of 1.25.    Patient engaged in session. He responded well to interventions. Patient continues to meet criteria for Major Depressive Disorder, recurrent, severe with anxious distress and Mixed Obsessional Thoughts and acts. He will continue in outpatient therapy due to being the least restrictive service to meet his needs at this time. Patient made minimal progress on his goals.   Suicidal/Homicidal: No  Therapist Response: Therapist reviewed patients recent thoughts and behaviors. Therapist utilized CBT to address depression and anxiety. Therapist followed up on patient's homework.  Therapist processed patients feelings to identify triggers for anxiety and intrusive thoughts. Therapist committed patient to improve distress tolerance 5 days a week for 15 minutes a day.  Therapist administered the Outcome Rating Scale and the Session rating scale.   Plan: Return again in 3 weeks.  Diagnosis: Axis I: Major Depression, Recurrent severe and Mixed obsessional thoughts and acts    Axis II: No diagnosis    Cody BellowsJoshua Myrtice Lowdermilk, LCSW 05/27/2017

## 2017-06-01 ENCOUNTER — Ambulatory Visit (INDEPENDENT_AMBULATORY_CARE_PROVIDER_SITE_OTHER): Payer: Medicaid Other | Admitting: Psychiatry

## 2017-06-01 ENCOUNTER — Encounter (HOSPITAL_COMMUNITY): Payer: Self-pay | Admitting: Psychiatry

## 2017-06-01 VITALS — BP 137/89 | HR 80 | Ht 67.0 in | Wt 178.4 lb

## 2017-06-01 DIAGNOSIS — G4700 Insomnia, unspecified: Secondary | ICD-10-CM

## 2017-06-01 DIAGNOSIS — Z813 Family history of other psychoactive substance abuse and dependence: Secondary | ICD-10-CM | POA: Diagnosis not present

## 2017-06-01 DIAGNOSIS — Z818 Family history of other mental and behavioral disorders: Secondary | ICD-10-CM

## 2017-06-01 DIAGNOSIS — F17210 Nicotine dependence, cigarettes, uncomplicated: Secondary | ICD-10-CM

## 2017-06-01 DIAGNOSIS — F332 Major depressive disorder, recurrent severe without psychotic features: Secondary | ICD-10-CM

## 2017-06-01 DIAGNOSIS — F422 Mixed obsessional thoughts and acts: Secondary | ICD-10-CM | POA: Diagnosis not present

## 2017-06-01 MED ORDER — FLUOXETINE HCL 20 MG PO CAPS: 20.0000 mg | ORAL_CAPSULE | Freq: Two times a day (BID) | ORAL | 2 refills | Status: DC

## 2017-06-01 MED ORDER — TRAZODONE HCL 150 MG PO TABS: 150.0000 mg | ORAL_TABLET | Freq: Every day | ORAL | 2 refills | Status: DC

## 2017-06-01 NOTE — Progress Notes (Signed)
Psychiatric Initial Adult Assessment   Patient Identification: Cody CongMichael E Collins MRN:  [id number] Date of Evaluation:  06/01/2017 Referral Source: Dr. Dimas AguasHoward, [facility] Chief Complaint:   Chief Complaint    Depression; Anxiety; Follow-up     Visit Diagnosis:    ICD-10-CM   1. Mixed obsessional thoughts and acts F42.2   2. Major depressive disorder, recurrent episode, severe with anxious distress (HCC) F33.2     History of Present Illness:  This patient is a 56 year old divorced white male who lives with his daughter her husband and their 2 children in [place]. 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  The patient was referred by Dr. Dimas AguasHoward for further treatment and assessment of depression and anxiety.  The patient states that he has had no previous mental health treatment per se. He is been depressed on and off for years. Much of this started a few years ago when he began treatment for hepatitis C and interferon made him feel sad and depressed. He's been primarily on Zoloft. Lately however he feels like his life is falling apart because of health issues. He states that he is always been "strong and able to work.". 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 He's had to quit work over the last year due to severe arthritis in his knees and legs. Last year he also had to have cervical disc surgery which is now failed and he is having severe numbness and pain down his left leg. His primary care physician tried to start him on Cymbalta but at the time he had no insurance and no way to pay for. He has run out of funds and has to stay with his daughter which is bothering him as well.  The patient has a long history of substance abuse. 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 He used it on and off for a number of years but stopped after he had his children. He has twins that are 4232 and a daughter that is 4230. His  wife was using far more than he was and he ended up gaining custody of the children when they were very young and he raised them. He had an affair with her babysitter later and she became pregnant and had a boy which she did not find out about until the child was 57 years old.  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 Unfortunately after that he began using methamphetamine and marijuana and alcohol again. He states that he was drinking fairly heavily until about 2 weeks ago but now he is down to 2-3 beers a day from 5 or 6 a day. He is no longer using any drugs.  Currently the patient states that his mood is somewhat depressed despite being on Zoloft 100 mg daily. He feels sad and overwhelmed. He can't sleep often due to pain. His energy is poor and he is irritable and grumpy. He is often nervous and anxious but denies panic attacks. He has been avoiding people. He's never been suicidal or homicidal. Sometimes he has obsessional repetitive thoughts. He laughs inappropriately all the time he states he can help himself as as it is his way of relieving stress  The patient returns after 4 weeks. Last time he was more depressed and I increased his Prozac to 40 mg  daily. He states that he is feeling better and has even had some moments of "joy" he also wasn't sleeping and I increased his trazodone to 150 mg and now he is sleeping much better. He is planning to move in the beginning of September to live with his daughter in [place]. He thinks this will be a much more supportive situation. I told him we could see him one more time before he leaves to make sure he has enough prescriptions. He no longer has any suicidal thoughts and he is not drinking at all  Associated Signs/Symptoms: Depression Symptoms:  depressed mood, anhedonia, insomnia, fatigue, feelings of worthlessness/guilt, anxiety, loss of  energy/fatigue, disturbed sleep, (Hypo) Manic Symptoms:  Irritable Mood, Anxiety Symptoms:  Excessive Worry,   Past Psychiatric History: None, other than treatment by primary care  Previous Psychotropic Medications: Yes   Substance Abuse History in the last 12 months:  Yes.    Consequences of Substance Abuse: Medical Consequences:  History of hepatitis C Legal Consequences:  History of one DUI  Past Medical History:  Past Medical History:  Diagnosis Date  . Arthritis   . Degenerative disc disease, cervical   . Diarrhea   . Electrocution 1996   from faulty wiring at pool  . Headache(784.0)    migraines  . Hepatitis HEP C   07/22/16 went through tx 3 times, clear at this time  . Stroke (cerebrum) (HCC) 1997  . Vitamin D deficiency     Past Surgical History:  Procedure Laterality Date  . CERVICAL DISC SURGERY  08/2015   fusion, pinched nerves  . COLONOSCOPY  03/11/2010  . ESOPHAGOGASTRODUODENOSCOPY  03/11/2010  . KNEE SURGERY Bilateral    2 surgeries on L, one on R- arthroscopic   . LIVER BIOPSY  05/20/2010    Family Psychiatric History: The patient's father was a heavy drug user as well. The mother has a history of depression  Family History:  Family History  Problem Relation Age of Onset  . Ovarian cancer Sister   . Ovarian cancer Sister   . Multiple sclerosis Sister   . Healthy Sister   . Pneumonia Brother   . Down syndrome Brother   . Healthy Daughter   . Depression Mother   . Parkinson's disease Mother   . Drug abuse Father   . Healthy Paternal Grandfather     Social History:   Social History   Social History  . Marital status: Single    Spouse name: N/A  . Number of children: 4  . Years of education: 39   Occupational History  .      disabled   Social History Main Topics  . Smoking status: Current Every Day Smoker    Packs/day: 1.00    Types: Cigarettes  . Smokeless tobacco: Never Used     Comment: Vapor, 07/22/16 smoking 10-15 cigs daily   . Alcohol use Yes     Comment: 04-15-2016 per pt 2-3 beer per day, 07/22/16 1-2  beers daily  . Drug use: No     Comment: 04-15-2016 per pt no. Per pt it's been years since using Meth   . Sexual activity: Not Currently   Other Topics Concern  . None   Social History Narrative   Lives with daughter    caffeine use- coffee- 3 cups daily    Additional Social History: The patient grew up in [place] with his mother and 5 siblings. His father really was not part of the family. He never  finished high school but left for [place] with his girlfriend at age 56. As noted above he used drugs heavily for a number of years. 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 He has been married once and divorced when his children were very young. He had another child with a babysitter whom he found out about years later. He is currently unemployed and applying for disability  Allergies:   Allergies  Allergen Reactions  . Ciprofloxacin Hcl Swelling    Per pt Throat Swells   . Codeine Itching  . Vitamin C [Ascorbic Acid] Other (See Comments)    Patient states that this causes problems with his ears.    Metabolic Disorder Labs: No results found for: HGBA1C, MPG No results found for: PROLACTIN No results found for: CHOL, TRIG, HDL, CHOLHDL, VLDL, LDLCALC   Current Medications: Current Outpatient Prescriptions  Medication Sig Dispense Refill  . acetaminophen (TYLENOL) 100 MG/ML solution Take by mouth every 4 (four) hours as needed. Reported on 04/15/2016    . Albuterol Sulfate (PROAIR HFA IN) Inhale into the lungs.      Marland Kitchen. allopurinol (ZYLOPRIM) 300 MG tablet Take 300 mg by mouth daily. As needed  5  . cholecalciferol (VITAMIN D) 1000 units tablet Take 1,000 Units by mouth daily.    . colchicine 0.6 MG tablet TAKE 2 TABLETS BY MOUTH ONCE THEN 1 TABLET ONE HOUR LATER MAY REPEAT NEXT DAY IF NEEDED  1  . FLUoxetine (PROZAC) 20 MG capsule Take 1 capsule (20 mg total) by mouth 2 (two) times daily. 60  capsule 2  . fluticasone (FLONASE) 50 MCG/ACT nasal spray INSTILL 2 SPRAYS IN NOSTRIL(S) EVERY MORNING  12  . gabapentin (NEURONTIN) 400 MG capsule Take 300 mg by mouth 3 (three) times daily.   5  . ibuprofen (ADVIL,MOTRIN) 800 MG tablet Take 800 mg by mouth every 8 (eight) hours as needed.    . loratadine (CLARITIN) 10 MG tablet Take 10 mg by mouth daily. Reported on 04/15/2016    . pantoprazole (PROTONIX) 40 MG tablet Take 40 mg by mouth daily. Reported on 04/15/2016    . PROAIR HFA 108 (90 Base) MCG/ACT inhaler INHALE 2 PUFFS INTO LUNGS FOUR TIMES DAILY  12  . traMADol (ULTRAM) 50 MG tablet TAKE 1 TABLET AS NEEDED EVERY 6 HOURS  0  . traZODone (DESYREL) 150 MG tablet Take 1 tablet (150 mg total) by mouth at bedtime. 30 tablet 2   No current facility-administered medications for this visit.     Neurologic: Headache: Yes Seizure: No Paresthesias:Yes  Musculoskeletal: Strength & Muscle Tone: decreased Gait & Station: unsteady Patient leans: N/A  Psychiatric Specialty Exam: Review of Systems  Constitutional: Positive for malaise/fatigue.  HENT: Positive for congestion.   Genitourinary: Positive for frequency.  Musculoskeletal: Positive for back pain, joint pain and neck pain.  Neurological: Positive for tingling, sensory change and focal weakness.  Psychiatric/Behavioral: Positive for depression. The patient is nervous/anxious and has insomnia.     Blood pressure 137/89, pulse 80, height 5\' 7"  (1.702 m), weight 178 lb 6.4 oz (80.9 kg).Body mass index is 27.94 kg/m.  General Appearance: Casual and Fairly Groomed  Eye Contact:  Good  Speech:  Clear and Coherent  Volume:  Normal   Mood:Fairly good   Affect:Brighter   Thought Process:  Coherent  Orientation:  Full (Time, Place, and Person)  Thought Content:  Rumination,  Suicidal Thoughts:  No   Homicidal Thoughts:  No  Memory:  Immediate;   Good  Recent;   Fair Remote;   Fair  Judgement:  Fair  Insight:  Fair  Psychomotor  Activity:  Restlessness  Concentration:  Concentration: Fair and Attention Span: Fair  Recall:  [employer] of Knowledge:Fair  Language: Good  Akathisia:  No  Handed:  Right  AIMS (if indicated):    Assets:  Communication Skills Desire for Improvement Resilience Social Support  ADL's:  Intact  Cognition: WNL  Sleep:  poor    Treatment Plan Summary: Medication management   Patient will continue trazodone 150 mg daily at bedtime. He will Continue Prozac20 mg twice a day  He'll return in 4 weeks but can call at any time prior to that of his depression worsens or go to the ED or call 911   Diannia Ruder, MD 7/30/20189:19 AM Patient ID: Cody Collins, male   DOB: [birth year], 56 y.o.   MRN: [id number]

## 2017-06-15 ENCOUNTER — Ambulatory Visit (INDEPENDENT_AMBULATORY_CARE_PROVIDER_SITE_OTHER): Payer: Medicaid Other | Admitting: Licensed Clinical Social Worker

## 2017-06-15 ENCOUNTER — Encounter (HOSPITAL_COMMUNITY): Payer: Self-pay | Admitting: Licensed Clinical Social Worker

## 2017-06-15 DIAGNOSIS — F422 Mixed obsessional thoughts and acts: Secondary | ICD-10-CM | POA: Diagnosis not present

## 2017-06-15 DIAGNOSIS — F332 Major depressive disorder, recurrent severe without psychotic features: Secondary | ICD-10-CM

## 2017-06-15 NOTE — Progress Notes (Signed)
   THERAPIST PROGRESS NOTE  Session Time: 8:00 am -8:45 am  Participation Level: Active  Behavioral Response: Casual Alert Anxious  Type of Therapy: Individual Therapy  Treatment Goals addressed: Anxiety and Diagnosis: Major depressive disorder, recurrent episode, severe with anxious distress  Interventions: CBT and Solution Focused  Summary: Cody Collins is a 56 y.o. male who presents oriented x5 (person, place, situation, time, and object). Patient has a history of medical treatment including neck surgery and mental health treatment including outpatient therapy and medication management. Patient denies psychosis including auditory and visual hallucinations. Patient denies homicidal ideations but admits to passive thoughts of suicide ("It would be better if I weren't here"). 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   Patient had an average score of 1.50  out of 10 on the Outcome Rating Scale which is the same from the last session. Patient reported that his situation at home is the same. He continues to be ignored by his daughter, son in law and grandson. Patient is taking steps to prepare to move with his other daughter including reducing the amount of stuff he has. Patient reported that his repeating behaviors continues to improve. He is going to apply the same kind of thinking to other parts of his life including reading with out having to re-read if he has a bad thought. Patient committed to continue to build distress tolerance by allowing thoughts to pass without repeating behaviors in all aspects of his life. Patient rated the session at 6.50 out of 10 on the session rating scale which is a increase of .25.    Patient engaged in session. He responded well to interventions. Patient continues to meet criteria for Major Depressive Disorder, recurrent, severe with anxious distress and Mixed Obsessional Thoughts and acts. He will  continue in outpatient therapy due to being the least restrictive service to meet his needs at this time. Patient made minimal progress on his goals.   Suicidal/Homicidal: No  Therapist Response: Therapist reviewed patients recent thoughts and behaviors. Therapist utilized CBT to address depression and anxiety. Therapist followed up on patient's homework.  Therapist discussed the connection between patient's environment and his compulsive behaviors. Therapist discussed the progress that patient has made on his compulsive behaviors. Therapist committed patient to continue manage his thoughts and compulsive behaviors through all aspects of his life.  Therapist administered the Outcome Rating Scale and the Session rating scale.   Plan: Return again in 1-2 weeks.  Diagnosis: Axis I: Major Depression, Recurrent severe and Mixed obsessional thoughts and acts    Axis II: No diagnosis    Cody BellowsJoshua Jontavious Commons, LCSW 06/15/2017

## 2017-06-29 ENCOUNTER — Encounter (HOSPITAL_COMMUNITY): Payer: Self-pay | Admitting: Psychiatry

## 2017-06-29 ENCOUNTER — Ambulatory Visit (INDEPENDENT_AMBULATORY_CARE_PROVIDER_SITE_OTHER): Payer: Medicaid Other | Admitting: Psychiatry

## 2017-06-29 VITALS — BP 126/78 | HR 74 | Ht 66.5 in | Wt 176.0 lb

## 2017-06-29 DIAGNOSIS — F332 Major depressive disorder, recurrent severe without psychotic features: Secondary | ICD-10-CM | POA: Diagnosis not present

## 2017-06-29 DIAGNOSIS — M542 Cervicalgia: Secondary | ICD-10-CM

## 2017-06-29 DIAGNOSIS — Z560 Unemployment, unspecified: Secondary | ICD-10-CM | POA: Diagnosis not present

## 2017-06-29 DIAGNOSIS — F17210 Nicotine dependence, cigarettes, uncomplicated: Secondary | ICD-10-CM | POA: Diagnosis not present

## 2017-06-29 DIAGNOSIS — R5381 Other malaise: Secondary | ICD-10-CM | POA: Diagnosis not present

## 2017-06-29 DIAGNOSIS — M549 Dorsalgia, unspecified: Secondary | ICD-10-CM | POA: Diagnosis not present

## 2017-06-29 DIAGNOSIS — F422 Mixed obsessional thoughts and acts: Secondary | ICD-10-CM

## 2017-06-29 DIAGNOSIS — G4700 Insomnia, unspecified: Secondary | ICD-10-CM | POA: Diagnosis not present

## 2017-06-29 DIAGNOSIS — Z818 Family history of other mental and behavioral disorders: Secondary | ICD-10-CM | POA: Diagnosis not present

## 2017-06-29 DIAGNOSIS — Z813 Family history of other psychoactive substance abuse and dependence: Secondary | ICD-10-CM | POA: Diagnosis not present

## 2017-06-29 DIAGNOSIS — Z8673 Personal history of transient ischemic attack (TIA), and cerebral infarction without residual deficits: Secondary | ICD-10-CM

## 2017-06-29 DIAGNOSIS — R5383 Other fatigue: Secondary | ICD-10-CM | POA: Diagnosis not present

## 2017-06-29 DIAGNOSIS — M2550 Pain in unspecified joint: Secondary | ICD-10-CM

## 2017-06-29 MED ORDER — TRAZODONE HCL 150 MG PO TABS: 150.0000 mg | ORAL_TABLET | Freq: Every day | ORAL | 2 refills | Status: AC

## 2017-06-29 MED ORDER — FLUOXETINE HCL 20 MG PO CAPS: 20.0000 mg | ORAL_CAPSULE | Freq: Two times a day (BID) | ORAL | 2 refills | Status: AC

## 2017-06-29 NOTE — Progress Notes (Signed)
Psychiatric Initial Adult Assessment   Patient Identification: Cody Collins MRN:  [id number] Date of Evaluation:  06/29/2017 Referral Source: Dr. Dimas Aguas, [facility] Chief Complaint:   Chief Complaint    Follow-up; Depression; Anxiety     Visit Diagnosis:    ICD-10-CM   1. Mixed obsessional thoughts and acts F42.2   2. Major depressive disorder, recurrent episode, severe with anxious distress (HCC) F33.2     History of Present Illness:  This patient is a 56 year old divorced white male who lives with his daughter her husband and their 2 children in [place]. 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  The patient was referred by Dr. Dimas Aguas for further treatment and assessment of depression and anxiety.  The patient states that he has had no previous mental health treatment per se. He is been depressed on and off for years. Much of this started a few years ago when he began treatment for hepatitis C and interferon made him feel sad and depressed. He's been primarily on Zoloft. Lately however he feels like his life is falling apart because of health issues. He states that he is always been "strong and able to work.". 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 He's had to quit work over the last year due to severe arthritis in his knees and legs. Last year he also had to have cervical disc surgery which is now failed and he is having severe numbness and pain down his left leg. His primary care physician tried to start him on Cymbalta but at the time he had no insurance and no way to pay for. He has run out of funds and has to stay with his daughter which is bothering him as well.  The patient has a long history of substance abuse. 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 He used it on and off for a number of years but stopped after he had his children. He has twins that are 69 and a daughter that is 48. His  wife was using far more than he was and he ended up gaining custody of the children when they were very young and he raised them. He had an affair with her babysitter later and she became pregnant and had a boy which she did not find out about until the child was 83 years old.  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 Unfortunately after that he began using methamphetamine and marijuana and alcohol again. He states that he was drinking fairly heavily until about 2 weeks ago but now he is down to 2-3 beers a day from 5 or 6 a day. He is no longer using any drugs.  Currently the patient states that his mood is somewhat depressed despite being on Zoloft 100 mg daily. He feels sad and overwhelmed. He can't sleep often due to pain. His energy is poor and he is irritable and grumpy. He is often nervous and anxious but denies panic attacks. He has been avoiding people. He's never been suicidal or homicidal. Sometimes he has obsessional repetitive thoughts. He laughs inappropriately all the time he states he can help himself as as it is his way of relieving stress  The patient returns after 4 weeks. His daughter is coming at the end of the week to get him and  he is moving to [facility]. He is both nervous and excited about it. He's not getting along well with the family he is staying with here and he thinks he'll be getting along much better with his family in [place]. He will have to reapply for Medicaid and set up healthcare providers there. Right now the Prozac is helping his mood and trazodone continues to help him sleep. I've given him a 90 day prescription today for both his mood is pretty good although anxious and he denies suicidal ideation  Associated Signs/Symptoms: Depression Symptoms:  depressed mood, anhedonia, insomnia, fatigue, feelings of worthlessness/guilt, anxiety, loss of  energy/fatigue, disturbed sleep, (Hypo) Manic Symptoms:  Irritable Mood, Anxiety Symptoms:  Excessive Worry,   Past Psychiatric History: None, other than treatment by primary care  Previous Psychotropic Medications: Yes   Substance Abuse History in the last 12 months:  Yes.    Consequences of Substance Abuse: Medical Consequences:  History of hepatitis C Legal Consequences:  History of one DUI  Past Medical History:  Past Medical History:  Diagnosis Date  . Arthritis   . Degenerative disc disease, cervical   . Diarrhea   . Electrocution 1996   from faulty wiring at pool  . Headache(784.0)    migraines  . Hepatitis HEP C   07/22/16 went through tx 3 times, clear at this time  . Stroke (cerebrum) (HCC) 1997  . Vitamin D deficiency     Past Surgical History:  Procedure Laterality Date  . CERVICAL DISC SURGERY  08/2015   fusion, pinched nerves  . COLONOSCOPY  03/11/2010  . ESOPHAGOGASTRODUODENOSCOPY  03/11/2010  . KNEE SURGERY Bilateral    2 surgeries on L, one on R- arthroscopic   . LIVER BIOPSY  05/20/2010    Family Psychiatric History: The patient's father was a heavy drug user as well. The mother has a history of depression  Family History:  Family History  Problem Relation Age of Onset  . Ovarian cancer Sister   . Ovarian cancer Sister   . Multiple sclerosis Sister   . Healthy Sister   . Pneumonia Brother   . Down syndrome Brother   . Healthy Daughter   . Depression Mother   . Parkinson's disease Mother   . Drug abuse Father   . Healthy Paternal Grandfather     Social History:   Social History   Social History  . Marital status: Single    Spouse name: N/A  . Number of children: 4  . Years of education: 52   Occupational History  .      disabled   Social History Main Topics  . Smoking status: Current Every Day Smoker    Packs/day: 1.00    Types: Cigarettes  . Smokeless tobacco: Never Used     Comment: smoking 3-5 cigs daily  . Alcohol use  No     Comment: sober since 08/2016  . Drug use: No     Comment: 04-15-2016 per pt no. Per pt it's been years since using Meth   . Sexual activity: Not Currently   Other Topics Concern  . None   Social History Narrative   Lives with daughter    caffeine use- coffee- 3 cups daily    Additional Social History: The patient grew up in [place] with his mother and 5 siblings. His father really was not part of the family. He never finished high school but left for [place] with his girlfriend at age 7. As noted  above he used drugs heavily for a number of years. 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 He has been married once and divorced when his children were very young. He had another child with a babysitter whom he found out about years later. He is currently unemployed and applying for disability  Allergies:   Allergies  Allergen Reactions  . Ciprofloxacin Hcl Swelling    Per pt Throat Swells   . Codeine Itching  . Vitamin C [Ascorbic Acid] Other (See Comments)    Patient states that this causes problems with his ears.    Metabolic Disorder Labs: No results found for: HGBA1C, MPG No results found for: PROLACTIN No results found for: CHOL, TRIG, HDL, CHOLHDL, VLDL, LDLCALC   Current Medications: Current Outpatient Prescriptions  Medication Sig Dispense Refill  . acetaminophen (TYLENOL) 100 MG/ML solution Take by mouth every 4 (four) hours as needed. Reported on 04/15/2016    . Albuterol Sulfate (PROAIR HFA IN) Inhale into the lungs.      Marland Kitchen allopurinol (ZYLOPRIM) 300 MG tablet Take 300 mg by mouth daily. As needed  5  . cholecalciferol (VITAMIN D) 1000 units tablet Take 1,000 Units by mouth daily.    . colchicine 0.6 MG tablet TAKE 2 TABLETS BY MOUTH ONCE THEN 1 TABLET ONE HOUR LATER MAY REPEAT NEXT DAY IF NEEDED  1  . FLUoxetine (PROZAC) 20 MG capsule Take 1 capsule (20 mg total) by mouth 2 (two) times daily. 180 capsule 2  . fluticasone (FLONASE) 50 MCG/ACT nasal spray  INSTILL 2 SPRAYS IN NOSTRIL(S) EVERY MORNING  12  . gabapentin (NEURONTIN) 400 MG capsule Take 300 mg by mouth 3 (three) times daily.   5  . ibuprofen (ADVIL,MOTRIN) 800 MG tablet Take 800 mg by mouth every 8 (eight) hours as needed.    . loratadine (CLARITIN) 10 MG tablet Take 10 mg by mouth daily. Reported on 04/15/2016    . pantoprazole (PROTONIX) 40 MG tablet Take 40 mg by mouth daily. Reported on 04/15/2016    . PROAIR HFA 108 (90 Base) MCG/ACT inhaler INHALE 2 PUFFS INTO LUNGS FOUR TIMES DAILY  12  . traMADol (ULTRAM) 50 MG tablet TAKE 1 TABLET AS NEEDED EVERY 6 HOURS  0  . traZODone (DESYREL) 150 MG tablet Take 1 tablet (150 mg total) by mouth at bedtime. 90 tablet 2   No current facility-administered medications for this visit.     Neurologic: Headache: Yes Seizure: No Paresthesias:Yes  Musculoskeletal: Strength & Muscle Tone: decreased Gait & Station: unsteady Patient leans: N/A  Psychiatric Specialty Exam: Review of Systems  Constitutional: Positive for malaise/fatigue.  HENT: Positive for congestion.   Genitourinary: Positive for frequency.  Musculoskeletal: Positive for back pain, joint pain and neck pain.  Neurological: Positive for tingling, sensory change and focal weakness.  Psychiatric/Behavioral: Positive for depression. The patient is nervous/anxious and has insomnia.     Blood pressure 126/78, pulse 74, height 5' 6.5" (1.689 m), weight 176 lb (79.8 kg).Body mass index is 27.98 kg/m.  General Appearance: Casual and Fairly Groomed  Eye Contact:  Good  Speech:  Clear and Coherent  Volume:  Normal   Mood:Fairly good   Affect:Bright but anxious   Thought Process:  Coherent  Orientation:  Full (Time, Place, and Person)  Thought Content:  Rumination,  Suicidal Thoughts:  No   Homicidal Thoughts:  No  Memory:  Immediate;   Good Recent;   Fair Remote;   Fair  Judgement:  Fair  Insight:  Fair  Psychomotor Activity:  Restlessness  Concentration:   Concentration: Fair and Attention Span: Fair  Recall:  [employer] of Knowledge:Fair  Language: Good  Akathisia:  No  Handed:  Right  AIMS (if indicated):    Assets:  Communication Skills Desire for Improvement Resilience Social Support  ADL's:  Intact  Cognition: WNL  Sleep:  poor    Treatment Plan Summary: Medication management   Patient will continue trazodone 150 mg daily at bedtime. He will Continue Prozac20 mg twice a day  He has been given a 90 day supply for both with 2 refills so that he will have enough until he can get in with a new provider in [place], [place], MD 8/27/201810:19 AM Patient ID: Sherril Cong, male   DOB: [birth year], 56 y.o.   MRN: [id number]

## 2017-06-30 ENCOUNTER — Ambulatory Visit (INDEPENDENT_AMBULATORY_CARE_PROVIDER_SITE_OTHER): Payer: Medicaid Other | Admitting: Licensed Clinical Social Worker

## 2017-06-30 DIAGNOSIS — F422 Mixed obsessional thoughts and acts: Secondary | ICD-10-CM | POA: Diagnosis not present

## 2017-06-30 NOTE — Progress Notes (Signed)
   THERAPIST PROGRESS NOTE  Session Time: 8:00 am -8:45 am  Participation Level: Active  Behavioral Response: Casual Alert Anxious  Type of Therapy: Individual Therapy  Treatment Goals addressed: Anxiety and Diagnosis: Major depressive disorder, recurrent episode, severe with anxious distress  Interventions: CBT and Solution Focused  Summary: Cody Collins is a 56 y.o. male who presents oriented x5 (person, place, situation, time, and object). Patient has a history of medical treatment including neck surgery and mental health treatment including outpatient therapy and medication management. Patient denies psychosis including auditory and visual hallucinations. Patient denies homicidal ideations but admits to passive thoughts of suicide ("It would be better if I weren't here"). 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   Patient had an average score of 1.75  out of 10 on the Outcome Rating Scale which is an increase of .25 from the last session. Patient reported that he is experiencing an increase in stress due to moving. Patient noted that he is trying to get rid of things so he can travel light. After discussion, patient understood his OCD diagnosis better. Patient reviewed his progress on goals and understood that services would be discontinued due to his moving. Patient committed to continue to work on distress tolerance until he finds a new provider in [place]. Patient rated the session at 7 out of 10 on the session rating scale which is a increase of .50   Patient engaged in session. He responded well to interventions. Patient continues to meet criteria for Major Depressive Disorder, recurrent, severe with anxious distress and Mixed Obsessional Thoughts and acts. He will continue in outpatient therapy due to being the least restrictive service to meet his needs at this time. Patient made minimal progress on his goals.    Suicidal/Homicidal: No  Therapist Response: Therapist reviewed patients recent thoughts and behaviors. Therapist utilized CBT to address depression and anxiety. Therapist processed patient feelings about moving. Therapist provided additional education on diagnosis so that patient can inform his next provider. Therapist reviewed patient goals and will discharge patient from service. Therapist committed patient to continue to work on distress tolerance.  Therapist administered the Outcome Rating Scale and the Session rating scale.   Plan: Discontinue services due to patient moving.  Diagnosis: Axis I: Major Depression, Recurrent severe and Mixed obsessional thoughts and acts    Axis II: No diagnosis    Bynum Bellows, LCSW 06/30/2017

## 2017-07-07 ENCOUNTER — Ambulatory Visit (HOSPITAL_COMMUNITY): Payer: Self-pay | Admitting: Licensed Clinical Social Worker

## 2017-07-13 ENCOUNTER — Ambulatory Visit (HOSPITAL_COMMUNITY): Payer: Self-pay | Admitting: Licensed Clinical Social Worker

## 2017-07-21 ENCOUNTER — Ambulatory Visit: Payer: Self-pay | Admitting: Diagnostic Neuroimaging

## 2017-07-22 ENCOUNTER — Encounter: Payer: Self-pay | Admitting: Diagnostic Neuroimaging

## 2017-10-16 ENCOUNTER — Telehealth (HOSPITAL_COMMUNITY): Payer: Self-pay | Admitting: *Deleted

## 2017-10-16 NOTE — Telephone Encounter (Signed)
Prior authorization for Duloxetine received. Submitted online with cover my meds.  Awaiting response 

## 2017-10-19 NOTE — Telephone Encounter (Signed)
Received notification today that Duloxetine has been approved for 10/16/2017- 10/17/2027 Approved #60/30 days x 10 years. Fax to [employer]

## 2017-10-21 ENCOUNTER — Telehealth (HOSPITAL_COMMUNITY): Payer: Self-pay | Admitting: *Deleted

## 2017-10-21 NOTE — Telephone Encounter (Signed)
[facility] stating Duloxetine has been approved from 10/16/17-10/17/2027. PA #[id number]

## 2018-05-03 IMAGING — XA Imaging study
2 series · 2 of 2 positions shown · non-contrast
Comparison: none

CLINICAL DATA: Lumbosacral spondylosis without myelopathy with
radiculopathy. Partial response to the prior caudal epidural
injection. Improvement of bilateral lower extremity pain and
cramping. Repeat caudal injection requested.

[Series 1: ortho standard · 1 of 1 slices shown (1 of 2)]
[im 1/1]
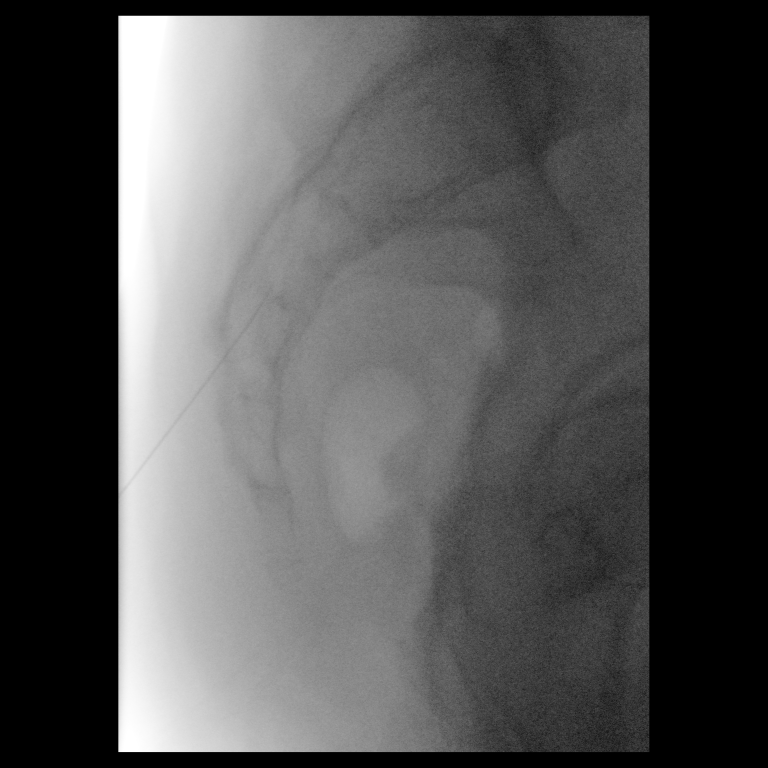

[Series 2: ortho standard · 1 of 1 slices shown (2 of 2)]
[im 1/1]
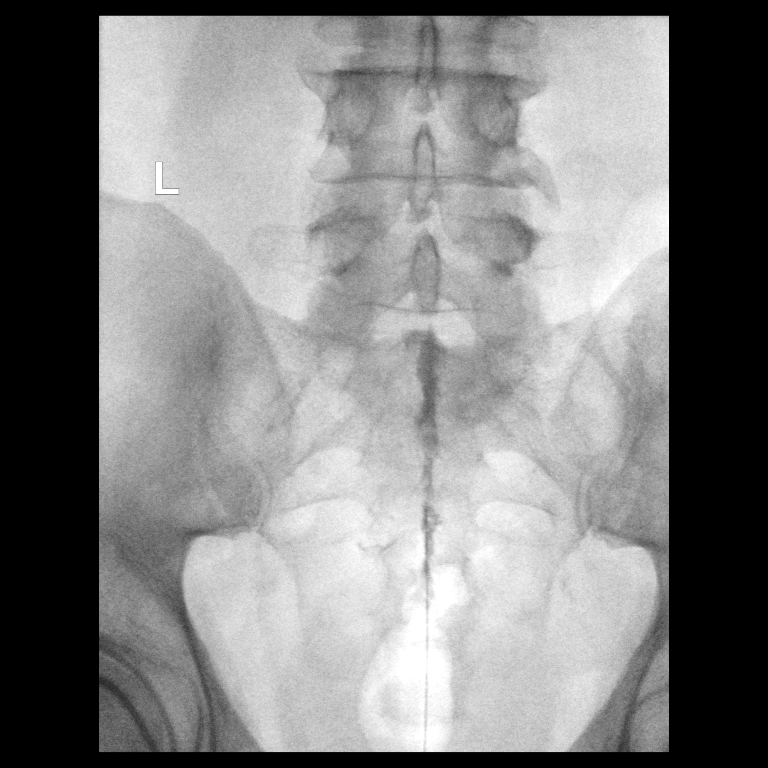

[2 of 2 positions shown; findings below may reference images not displayed]

FLUOROSCOPY TIME:  Radiation Exposure Index (as provided by the
fluoroscopic device): 35.50 microGray*m^2

Fluoroscopy Time (in minutes and seconds):  2 seconds

EXAM:
CAUDAL EPIDURAL INJECTION

Utilizing a caudal approach, the skin overlying the sacral hiatus
was cleansed and anesthetized. A 3.5 inch 22 gauge epidural needle
was advanced into the sacral epidural space. Injection of Isovue-M
200 shows a good epidural pattern with spread up to L5-S1. No
vascular opacification is seen.

120 mg of Depo-Medrol mixed with 3 ml of normal saline and 3 ml of
1% Lidocaine were instilled. The procedure was well-tolerated, and
the patient was discharged thirty minutes following the injection in
good condition.
IMPRESSION: Technically successful caudal epidural injection.

## 2018-07-06 IMAGING — XA Imaging study
2 series · 2 of 2 positions shown · non-contrast
Comparison: none

CLINICAL DATA: Spondylosis without myelopathy. Low back and leg
pain with leg and foot cramping. Some improvement falling the last
injection but with recurrence of symptoms.

[Series 1: ortho adipose · 1 of 1 slices shown (1 of 2)]
[im 1/1]
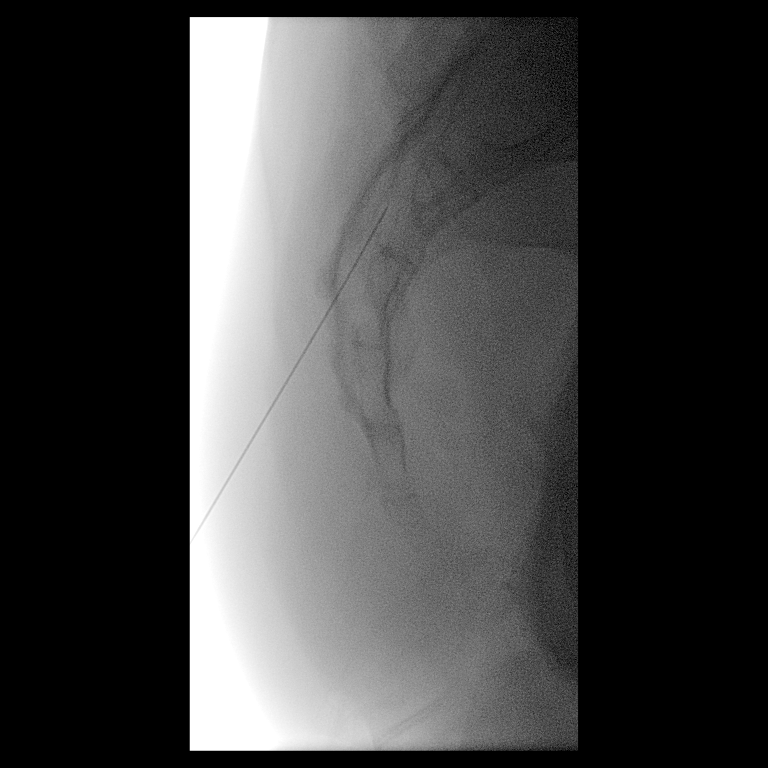

[Series 2: ortho adipose · 1 of 1 slices shown (2 of 2)]
[im 1/1]
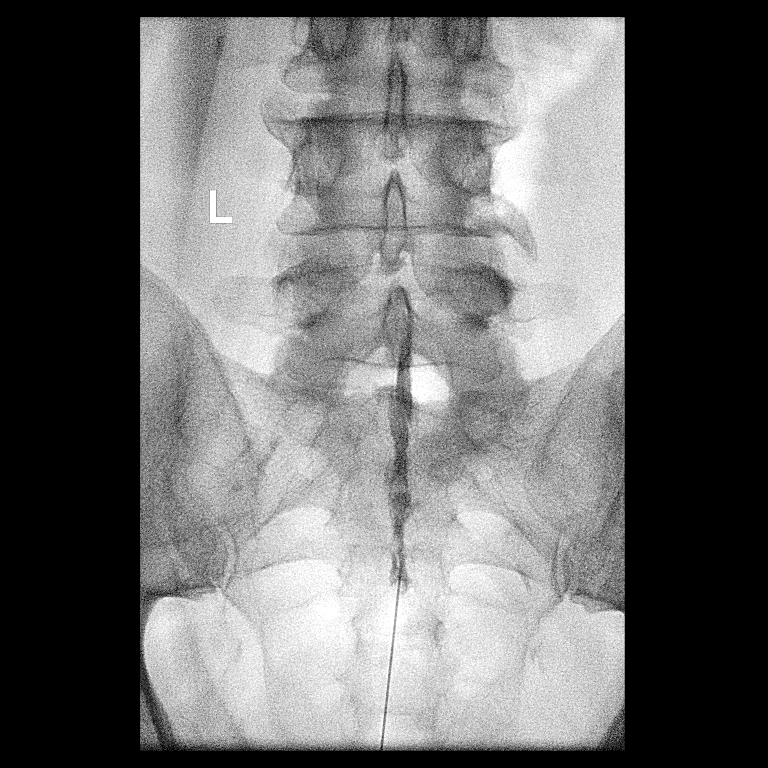

[2 of 2 positions shown; findings below may reference images not displayed]

FLUOROSCOPY TIME:  0 minutes 6 seconds. 27.08 micro gray meter
squared

EXAM:
CAUDAL EPIDURAL INJECTION

Utilizing a caudal approach, the skin overlying the sacral hiatus
was cleansed and anesthetized. A 20 gauge epidural needle was
advanced into the sacral epidural space. Injection of Isovue-M 200
shows a good epidural pattern with spread up to L5-S1. No vascular
opacification is seen.

120 mg of Depo-Medrol mixed with 3 ml of normal saline and 3 ml of
1% Lidocaine were instilled. The procedure was well-tolerated, and
the patient was discharged thirty minutes following the injection in
good condition.
IMPRESSION: Technically successful caudal epidural injection #3.

## 2021-07-03 ENCOUNTER — Encounter: Admit: 2021-07-03 | Discharge: 2021-07-03 | Payer: MEDICARE

## 2021-07-03 ENCOUNTER — Ambulatory Visit: Admit: 2021-07-03 | Discharge: 2021-07-03 | Payer: MEDICARE

## 2021-07-03 DIAGNOSIS — G5621 Lesion of ulnar nerve, right upper limb: Secondary | ICD-10-CM

## 2021-07-03 DIAGNOSIS — G629 Polyneuropathy, unspecified: Secondary | ICD-10-CM

## 2021-07-03 DIAGNOSIS — M25531 Pain in right wrist: Secondary | ICD-10-CM

## 2021-07-03 DIAGNOSIS — R519 Generalized headaches: Secondary | ICD-10-CM

## 2021-07-03 DIAGNOSIS — R7303 Prediabetes: Secondary | ICD-10-CM

## 2021-07-03 DIAGNOSIS — H547 Unspecified visual loss: Secondary | ICD-10-CM

## 2021-07-03 DIAGNOSIS — M5412 Radiculopathy, cervical region: Secondary | ICD-10-CM

## 2021-07-03 DIAGNOSIS — M1990 Unspecified osteoarthritis, unspecified site: Secondary | ICD-10-CM

## 2021-07-03 DIAGNOSIS — E039 Hypothyroidism, unspecified: Secondary | ICD-10-CM

## 2021-07-03 DIAGNOSIS — R079 Chest pain, unspecified: Secondary | ICD-10-CM

## 2021-07-03 LAB — VITAMIN B12: VITAMIN B12: 207 pg/mL (ref 180–914)

## 2021-07-03 LAB — CBC AND DIFF
ABSOLUTE BASO COUNT: 0 K/UL (ref 0–0.20)
ABSOLUTE EOS COUNT: 0 K/UL (ref 0–0.45)
RBC COUNT: 4.6 M/UL (ref 4.4–5.5)
WBC COUNT: 8.1 K/UL (ref 4.5–11.0)

## 2021-07-03 LAB — COMPREHENSIVE METABOLIC PANEL
ALBUMIN: 4.6 g/dL — ABNORMAL LOW (ref 3.5–5.0)
ALK PHOSPHATASE: 62 U/L (ref 25–110)
ALT: 16 U/L (ref 7–56)
ANION GAP: 14 K/UL — ABNORMAL HIGH (ref 3–12)
AST: 22 U/L (ref 7–40)
BLD UREA NITROGEN: 12 mg/dL (ref 7–25)
CO2: 25 MMOL/L (ref 21–30)
CREATININE: 0.9 mg/dL (ref 0.4–1.24)
EGFR: >60 mL/min (ref >60)
GLUCOSE,PANEL: 90 mg/dL (ref 70–100)
POTASSIUM: 4.6 MMOL/L (ref 3.5–5.1)
SODIUM: 144 MMOL/L (ref 137–147)
TOTAL PROTEIN: 7.3 g/dL (ref 6.0–8.0)

## 2021-07-03 LAB — HEMOGLOBIN A1C: HEMOGLOBIN A1C: 5.9 % (ref 4.0–6.0)

## 2021-07-03 LAB — TSH WITH FREE T4 REFLEX: TSH: 2.3 uU/mL (ref 0.35–5.00)

## 2021-07-03 NOTE — Progress Notes
Procedure Time Out Check List:  -Prior to the start of the procedure, I personally confirmed the following:  -Patient Identity (name & date of birth): Yes  -Procedure: Yes  -Site: Yes  -Body Part: Yes  -The risks of the procedure, including infection, bleeding, and pain, were discussed with the patient.    PAIN before: 7  PAIN after: EMG tolerated well.

## 2021-07-09 ENCOUNTER — Encounter: Admit: 2021-07-09 | Discharge: 2021-07-09 | Payer: MEDICARE

## 2021-07-09 NOTE — Telephone Encounter
Phone call to Casimiro Needle, no answer, LVM w/ call back number to inform him of Dr. Lauralee Evener' message:    "Neuropathy labs have come back unremarkable aside from the normal but low normal B12. As we discussed on Wednesday still recommend starting on oral over the counter B12 supplement taken at 1000 mg daily."

## 2022-05-21 ENCOUNTER — Encounter: Admit: 2022-05-21 | Discharge: 2022-05-21 | Payer: MEDICARE

## 2022-05-26 ENCOUNTER — Encounter: Admit: 2022-05-26 | Discharge: 2022-05-26 | Payer: MEDICARE

## 2022-05-26 NOTE — Telephone Encounter
Called to get patient scheduled for outpatient cardiac rehab. Left message with contact information.

## 2022-05-27 ENCOUNTER — Encounter: Admit: 2022-05-27 | Discharge: 2022-05-27 | Payer: MEDICARE

## 2022-05-27 NOTE — Telephone Encounter
Patient called to get scheduled for outpatient cardiac rehab. He is scheduled for August 8th at 10am.

## 2022-06-10 ENCOUNTER — Encounter: Admit: 2022-06-10 | Discharge: 2022-06-10 | Payer: MEDICARE

## 2022-06-10 DIAGNOSIS — I2111 ST elevation (STEMI) myocardial infarction involving right coronary artery: Secondary | ICD-10-CM

## 2022-06-10 DIAGNOSIS — Z955 Presence of coronary angioplasty implant and graft: Secondary | ICD-10-CM

## 2022-06-10 NOTE — Progress Notes
Name: Chad Hawkins           MRN: [id number]                 DOB: [birth year]          Age: 61 y.o.    Initial Cardiac Rehab Orientation note:    Chad Hawkins completed an initial orientation appointment for starting the Outpatient Cardiac Rehabilitation program today.   Patient was present from 9:55 to 11:45, with a total of 110 minutes spent in counseling, education, and assessment.  During the appointment, 70 minutes were spent performing program discussion, patient assessment, education, and development of the individualized ITP, while an additional 40 minutes were spent performing telemetry monitoring, vitals assessment, and an initial exercise assessment.     Patient is s/p STEMI and stent on 04/24/22.  We discussed details of the program including program goals, schedule, patient expectations, etc.  Patient plans to attend the program three times per week starting on 06/11/22.   The patient's medical history, cardiovascular risk factors, medication list, and discharge instructions were reviewed.  Pt completed an initial physical assessment, nutritional assessment, depression screening, and exercise assessment.   Home exercise and activity recommendations discussed.  An initial Individualized Treatment Plan (ITP) was created, which includes patient goals, initial assessment, and an initial exercise prescription.  ITP was sent to the Cardiac Rehab medical director for review.  Please see this documentation for further information if needed.  We will plan to progress the patient as tolerated.  All the patient's questions were answered at this time.     For a complete list of patient's medical history, please refer to their active problem list or medical history Snapshot in [facility].     Special needs and considerations: Mobility limitations/orthopedic issues and RD counseling  Fall Risk: Yes        Name: Chad Hawkins           MRN: [id number]                 DOB: [birth year]          Age: 61 y.o.    CARDIAC REHAB INDIVIDUALIZED TREATMENT PLAN - Initial Assessment         Gender:   Male      Diagnosis:   PCI (Angioplasty and/or Stent) and STEMI  Physician: Dr. Kerin Hawkins         ITP Date: 06/10/22    Exercise        Assessment    First Day exercise:  Modality: stepper     Time: 12     First Day METs: 2.9    Resting HR:  68      Max HR: 90    Symptoms noted: Orthopedic pain: knees    DASI: 16.20    Home Frequency:  0 days/week     Home Duration: 0 minutes/bout    Total Exercise: 0 minutes/week     Comments: Chad Hawkins states that he has typically been pretty active doing ADLs, yardwork, and walking his dog.  Walking his dog takes typically an hour, but is very stop and go.  Since his procedure 6 weeks ago, he has been limiting activity due to not knowing if he is safe to return to many of his activities.  We discussed activity guidelines and recommendations.  He is encouraged to return to most light-moderate ADLs. We also discussed obtaining 30-60 minutes of structured exercise daily, as well as exercise goals. Pt will look  into a gym membership or sliver sneakers options as well, as he is somewhat limited due to orthopedic knee pain and would like the option of a pool.    Plan (Goals, Intervention, Education)    Goals:   -Attend CR Regularly (2-3x/wk)  -Exercise at least 150 min/wk  -Exercise at home 2-3 days/wk     Exercise Goals:    Improve strength and stamina by establishing a regular exercise routine., Improve energy level and ability to get back to ADLs, gardening, yardwork, walking the dog by establishing an exercise program and completing the Cardiopulmonary Rehab Program., Establish an exercise program 5-7 days per week 30-60 minutes per day., and Learn Exercise limitations based on target HR, RPE, and symptoms.              Intervention:  Exercise Prescription  Mode/Duration/Intensity:   Treadmill: 12 minutes/bout, 1.3 mph @ 0% grade  Bike: 12 minutes/bout, 25 average watts  Stepper:  12 minutes/bout, 25-45 average watts  UBE: 14 minutes/bout, 20 average watts    Freq (d/week):  3  HR Range: Rest + 20-30 bpm    UBR: None    Progression:  As tolerated per THR/RPE/symptoms. and Increase time on all modalities to 15 minutes each, then increase intensity by 10-20% per week.                                                                     Education:                           Home exercise guidelines  Date: 06/10/22      Pt. Response:  Verbalized Understanding      Nutrition    Assessment    Labs: Most Recent  Lab Results   Component Value Date    AST 22 07/03/2021    ALT 16 07/03/2021       History of Hyperlipidemia:  Yes     Cholesterol medication: Yes, lipitor    RYP Score: 61        Weight: 181.3 lbs     Ht: 67 in    BMI: 28.4      Overweight (25-29.9)       Waist: 42 inches    Comments: Pt reports that he has been working on improving his diet over the past 6 weeks. He reports reading labels and limiting sodium, decreasing cholesterol, decreasing transfats, working on limiting sugar, and eating more whole grains and oatmeal.  He reports that he is having to adjust his tastes, as things are bland without salt.  He is utilizing a no-salt substitute and we discussed this is okay in moderation. Pt is congratulated on his efforts thus far.  He reports he is down about 15 lbs since prior to his procedure, and that he would like to lose another 15. He does admit to having one cheat meal every week as a reward.   He will be scheduled with our RD to further discuss diet and weight loss.        Plan (Goals, Intervention, Education)    Goals:  Goal Weight: 165-170 lbs      -BMI <25  -Compliant with cholesterol medication  -HDL >40  men, >50, women  -LDL <100 (non), <70 (cardiac)  -Waist <40 men, <35 women    Dietary Goals: Weight loss of 10-15 lbs through portion control and heart healthy diet. , See Cardiopulmonary Rehab Dietitian. , Follow a low sodium diet of <2,000 mg per day., Follow a heart healthy and low cholesterol diet to help improve lipid values, and Work on portion control      Intervention:     RD appointment, to be scheduled  Staff/Patient Discussion       Education:                            Weight Loss Strategies    Date: 06/10/22 and Sodium Reduction   Date: 06/10/22    Pt. response: Verbalized Understanding          Psychosocial Assessment    Assessment    PHQ-9: 2                 PHQ-9 class: None (0-4)  COOP Questionnaire: 20    History of Depression  Anti-depressant medication: Prozac    Alcohol Use - Type: Beer  1 to 3 Drinks per week  Drug Use-  Type: None   -Freq: 0 day/wk     Comments: Pt reports that he used to drink more alcohol, but since his procedure he has been having just 1-2 beers per week.  He denies drug usage, SI, or abuse. Pt has a history of depression and reports that overall he feels he is doing pretty well, and is not as bad as he used to be. Pt is on Prozac which he states helps.  He reports good support from family and roommates. [facility] point information and pamphlet given and reviewed. PHQ-9 low.     Plan (Goals, Intervention, Education)  Goals:   -Absence of Depression  -Reduce PHQ-9 if applicable  -Sleep Management techniques  -Stress Management techniques    Psychosocial Goals: Improve QOL by being able to return to all ADLs and recreational activities.    Intervention:  Identify Stressors  Staff/Patient Discussion    Education:                           Cardiac Support Group and Emotional Support options   Date: 06/10/22    Pt. response: Verbalized Understanding       BP Control (Other Core Component)    Assessment    BP (L): 124/70      BP (R): 134/78           History of Hypertension: Yes     Self-monitoring blood pressure at home: Yes      Comments: Pt reports that his blood pressure has been elevated typically in the 160s, but recently has been coming down.  We dicussed goal ranges for BP, as well as monitoring daily at home.  We also discussed that regular exercise, weight loss, tobacco cessation. Improved diet, etc, will all help with bP management. BP is within the normal range today, slightly elevated on right side.. Will continue to monitor.      Plan (Goals, Intervention, Education)  Goals:   -Blood Pressure <130/80  -Medication compliance  -Monitoring regularly  -Sodium <2,000 mg/day    Blood Pressure Goals: Continue to monitor BP routinely at home and notify MD of elevated readings and Maintain BP <130/80 through increased activity level, low sodium diet, medication compliance, etc.  Intervention:   Staff/Patient Discussion    Education:         Blood Pressure Ranges and Home Monitoring   Date: 06/10/22 and Cardiovascular Risk Factors and Risk Factor Modifications  Date: 06/10/22                                                                                    Pt. response: Verbalized Understanding      Diabetes (Other Core Component)    Assessment    FBG:  98          HgA1c: n/a  Labs:  Date: 04/24/22      Comments: Pt is not diabetic. Is pre-diabetic.  No A1C available in chart.  Pt is not currently on any diabetic medication and does not monitor at home. Pt has been working on reducing sweet tea and sugar to avoid diabetes.  Weight loss and regular exercise will help as well.     Plan (Goals, Intervention, Education)      [x]  Not applicable, patient is not diabetic    Goals:   -N/A    Goals:  N/A    Intervention:  Staff/Patient Discussion    Education:                                                               None    Pt. Response: N/A        Tobacco Cessation (Other Core Component)    Assessment     Tobacco use status: Current Smoking    Quit Date: currently smoking      PPD: 1.0       Years used: 45    Comments: Pt reports a long history of smoking.  He reports he has been working on quitting smoking and is currently down to 4-5 cig/day from 1.0 ppd.  Pt is currently using the 21mg  Nicotine patch, but removes the patch to smoke for fear of overdoing it.  Pt is congratulated on his reduction thus far.  We discussed proper patch usage, as well as the ability to use combo NRTs. Pt was offered gum but never used.  He was encouraged to utilized the 4 mg gum in lew of cigarettes to manage cravings while on the patches.  We also discussed identifying and planning for urges.  PT was given the [employer] and pamphlet, and was encouraged to contact them for counseling and NRT product. Pt voiced understanding.    Plan (Goals, Intervention, Education) []  Not applicable, patient is not a current tobacco user     Goals:   -Complete cessation    Goals: Quit smoking by the end of the program.  and Contact the [place] [facility]uit Tobacco Cessation Line to obtain more resources and support for tobacco cessation.     Intervention:   Alternatives (gum, patch, medication, etc)  Oletha Cruel [employer] given  Staff/Patient discussion    Education:  Smoking and Tobacco Cessation  Date: 06/10/22 and Oletha Cruel Line resources   Date: 06/10/22    Pt. Response: Verbalized Understanding      Prevention/Management of CHF Exacerbations (Other Core Component)    Assessment    Edema: No:   -Location: None  -Severity: None    Lung sounds:  N/A    Recent Hospitalization: None    EF: 50-55%    Comments: Pt does not have a history of CHF. EF in the normal range.  Pt reports no CHF symptoms. Pt does feel he gets fatigued easily with activity, but this is likely due to conditioning. VS and telemetery stable.  Pt working on a low sodium diet.  Discussed obtaining a scale to weigh daily.     Plan (Goals, Intervention, Education)   [x]  Not applicable, patient is does not have CHF with reduced EF (<35%)     Goals:   -Control Symptoms  -Keep all medical appointments  -Medication compliance    Goals:  Prevent another hospital readmission by participating in Cardiac Rehab, medication compliance, and notifying MD of any signs/symptoms., Learn signs and symptoms to watch for, and when to notify MD. , Obtain a scale for at home to monitor weight daily. , and Follow a low sodium diet of 2,000 mg or less per day.    Interventions:  Daily Weights  Sodium Intake < 2000 mg/day  Staff/Patient discussion    Education:  Daily Weighing   Date: 06/10/22 and Signs and symptoms to watch for  Date: 06/10/22    Pt. response: Verbalized Understanding    Medications    Assessment    ACE/ARB  Antiplatelet  Aspirin  Beta Blocker  Statins    Comments: pt reports compliance with medications.  Encouraged to carry albuterol with him.  Encouraged to notify staff of any changes.      Plan (Goals, Intervention, Education)  Goals:   -Compliance = 80%    Goals: Maintain compliance with all prescribed medications, and notify MD of any medications concerns. and Can my rescue inhaler and use it appropriately    Intervention:   Medication Review completed  Staff/Patient Discussion    Education:        Angina/Nitro   Date: 06/10/22 and Medication Review  Date: 06/10/22    Pt. response: Verbalized Understanding      Additional Comments:    Pt completed an initial orientation appointment for starting the Outpatient Cardiac Rehab program.  Pt plans to attend three per week starting on 06/11/22.  Pt completed all initial assessments and paperwork today, including initial exercise assessment. VS and telemetry within normal limits. Rhythm showed NSR with no ectopy noted.  Pt did not report any unusual symptoms.  Education provided on home exercise recommendations, signs/symptoms, heart healthy diet, daily weighing, etc.  Medications reviewed. We will plan to progress as tolerated to help the patient work meeting their goals. All questions answered at this time.

## 2022-06-11 ENCOUNTER — Encounter: Admit: 2022-06-11 | Discharge: 2022-06-11 | Payer: MEDICARE

## 2022-06-11 DIAGNOSIS — I213 ST elevation (STEMI) myocardial infarction of unspecified site: Secondary | ICD-10-CM

## 2022-06-16 ENCOUNTER — Encounter: Admit: 2022-06-16 | Discharge: 2022-06-16 | Payer: MEDICARE

## 2022-06-16 DIAGNOSIS — I213 ST elevation (STEMI) myocardial infarction of unspecified site: Secondary | ICD-10-CM

## 2022-06-18 ENCOUNTER — Encounter: Admit: 2022-06-18 | Discharge: 2022-06-18 | Payer: MEDICARE

## 2022-06-18 DIAGNOSIS — I213 ST elevation (STEMI) myocardial infarction of unspecified site: Secondary | ICD-10-CM

## 2022-06-20 ENCOUNTER — Encounter: Admit: 2022-06-20 | Discharge: 2022-06-20 | Payer: MEDICARE

## 2022-06-20 DIAGNOSIS — I213 ST elevation (STEMI) myocardial infarction of unspecified site: Secondary | ICD-10-CM

## 2022-06-25 ENCOUNTER — Encounter: Admit: 2022-06-25 | Discharge: 2022-06-25 | Payer: MEDICARE

## 2022-06-25 DIAGNOSIS — I213 ST elevation (STEMI) myocardial infarction of unspecified site: Secondary | ICD-10-CM

## 2022-06-27 ENCOUNTER — Encounter: Admit: 2022-06-27 | Discharge: 2022-06-27 | Payer: MEDICARE

## 2022-06-27 DIAGNOSIS — I213 ST elevation (STEMI) myocardial infarction of unspecified site: Secondary | ICD-10-CM

## 2022-06-30 ENCOUNTER — Encounter: Admit: 2022-06-30 | Discharge: 2022-06-30 | Payer: MEDICARE

## 2022-06-30 DIAGNOSIS — I213 ST elevation (STEMI) myocardial infarction of unspecified site: Secondary | ICD-10-CM

## 2022-07-02 ENCOUNTER — Encounter: Admit: 2022-07-02 | Discharge: 2022-07-02 | Payer: MEDICARE

## 2022-07-02 DIAGNOSIS — I213 ST elevation (STEMI) myocardial infarction of unspecified site: Secondary | ICD-10-CM

## 2022-07-04 ENCOUNTER — Encounter: Admit: 2022-07-04 | Discharge: 2022-07-09 | Payer: MEDICARE

## 2022-07-09 ENCOUNTER — Encounter: Admit: 2022-07-09 | Discharge: 2022-07-09 | Payer: MEDICARE

## 2022-07-09 DIAGNOSIS — I213 ST elevation (STEMI) myocardial infarction of unspecified site: Secondary | ICD-10-CM

## 2022-07-09 NOTE — Progress Notes
Name: Chad Hawkins           MRN: [id number]                 DOB: January 23, 1961          Age: 61 y.o.    CARDIAC REHAB INDIVIDUALIZED TREATMENT PLAN - Reassessment:  30 Day    ITP Date: 07/09/22    Diagnosis:  STEMI      Physician: Jamison Oka    Sessions Completed: 10    Exercise     Assessment  Home Freq: 2-3 days/wk     Home Dur: 50 min/day    Total Exercise: 235 minutes/week    1st Exercise Day: 06/10/22  3rd Day METS: 2.6  Previous ITP METS: 2.9     Plan (Goals, Intervention, Education)    Goals    -Attend CR Regularly (2-3x/wk)  -Exercise at least 150 min/wk  -Exercise at home 2-3 days/wk    Progress towards Exercise Goals: pt reports that he is feeling stronger and feels his heart is stronger.  He is back to most of his ADLs and gardening, and seems to be more limited by orthopedic issues than cardiac issues.  He reports exercising 2-3 days per week at [employer] by walking for 20 minutes on a TM and swimming in the pool for 30 minutes.  He also walks his dog occasionally.     Intervention                Exercise Prescription:  Mode/Duration/Intensity:   Treadmill: 15 minutes/bout, 2.6 mph @ 0% grade  Bike: 15 minutes/bout, 45 average watts  Stepper:  15 minutes/bout, 50 average watts  UBE: 15 minutes/bout, 30 average watts    Freq (d/week):  3     HR Range: Rest + 20-30 bpm  UBR: None  Current METs: 3.0-4.6    Progression:  As tolerated per THR/RPE/symptoms., Increase Watts and METs by 10-20% per week as tolerated.  and Work on increasing grade on treadmill.    Comments: Pt is progressing well thus far. Will continue to progress as tolerated.    Education   Home exercise guidelines  Date: 07/09/22 and Mets, Physical Activity vs. Exercise, Weather Considerations   Date: 06/11/22     Pt. Response:  -Verbalized Understanding    Nutrition    Assessment  Ht:   Ht Readings from Last 1 Encounters:   No data found for Ht       Weight: 183 lbs     BMI: 28.7     Overweight (25-29.9)     Plan (Goals, Intervention, Education)  Goals   -BMI <25  -Compliant with cholesterol medication  -Improvement in Lipid Panel   -Waist <40 men, <35 women  -Weight loss     Progress towards Dietary Goals:pt's weight has fluxuated within a few pounds, but is overall stable.  He has an appt to see the RD next week and wants to focus on weight loss. We discussed limiting portions as well as cutting out snacks and extra caloric intake.  He has been working on some heart healthy changes including increased fruits and vegetables, trying to limit sodium, and switching to 2% milk.     Intervention   RD appointment, 07/16/22 scheduled.  Staff/Patient Discussion    Comments: will continue to follow-up on dietary changes and weight loss goals.     Education   Weight Loss Strategies    Date: 07/09/22    Pt. response: -Verbalized Understanding  Psychosocial Assessment    Assessment     S/s of depression  Alcohol use: none, have cut out  Drug use: none      Plan (Goals, Intervention, Education)  Goals   -Absence of Depression  -Reduce PHQ-9 if applicable  -Sleep Management techniques  -Stress Management techniques     Progress towards Psychosocial Goals: Pt reports that he is doing okay but occasionally feels a little down.  He does take medication, which he feels is helpful.  One of his challenges is that he lives with roommates, who may not be the most conducive to some of his lifestyle changes such as quitting smoking and drinking.  Pt is aware of resources if needed, denies any further needs at this time.     Intervention   Identify Stressors  Staff/Pt Discussion    Comments: No further needs at this time.  PHQ-9 low.     Education   Anxiety, Depression, Intimacy   Date: 06/18/22 and Stress and Time Management   Date: 06/25/22     Pt. Response: -Verbalized Understanding    BP Control (Other Core Component)    Assessment  BP: 134/80      History of Hypertension: Yes     Self-monitoring blood pressure at home:  Yes     Plan (Goals, Intervention, Education)  Goals   -Blood Pressure <130/80  -Medication compliance  -Monitoring regularly  -Sodium <2,000 mg/day    Progress towards Blood Pressure Goals: Pt continues to monitor regularly at home. BP's in Cardiac rehab are often in the 130s and diastilic in the 70s and 80s. Discussed the goal of getting down below 130 and 80.  Discussed continued exercise and weight loss will help.  We also discussed reducing caffeine intake, as he drinks 2 cups of coffee in the AM, then iced tea throughout the day.     Intervention   Staff/Patient Discussion    Comments: will continue to monitor    Education   Cardiovascular Risk Factors and Risk Factor Modifications  Date: 07/09/22    Pt. response: -Verbalized Understanding    Diabetes (Other Core Component)    Assessment     FBG:   Lab Results   Component Value Date    GLU 90 07/03/2021        Is patient Diabetic?  No, patient does not have Diabetes.     Tobacco Cessation (Other Core Component)      Assessment      Current Smoking  PPD: 5 cig/day    Does patient use tobacco? Yes  Plan (Goals, Intervention, Education)  Goals  -Reduction in use: reduce down to 4 cig/day this week  Progress towards Tobacco Goals: pt maintains that he is working on quitting, but seems hesitant to fully commit to quitting or setting a quit plan at this point. He states that he cant just quit cold [place] and needs to cut down gradually, yet he is still smoking the same amount as 1 month ago.  He has patches at home, but has not tried.  Strongely encouraged considering using NRTs, and discussed patches and gum.  He was willing to work on cutting down to 4 cig/day this week.  Pt reminded again about the [place] resources.   Intervention  Alternatives (gum, patch, medication, etc)  Oletha Cruel [employer] given  Staff/Patient discussion  Comments: Will continue to follow-up and offer encouragement.  Pt wants to quit, but seems hesitant to fully committ at this time.  Education  Smoking and Tobacco Cessation  Date: 07/09/22 and Oletha Cruel [place] resources   Date: 07/09/22  Pt. Response: -Verbalized Understanding and -Needs Reinforcement  Does patient use tobacco? No, patient does not use tobacco.     Prevention/Management of CHF Exacerbations (Other Core Component)    Assessment  Denies any current symptoms  Edema: n/a  Lung Sounds: n/a  Recent hospitalization: none  Updated EF: none, 50-55%    Plan (Goals, Intervention, Education)  Goals  -Control Symptoms  -Keep all medical appointments  -Medication compliance  Progress towards CHF Goals: Pt weighs daily, and is working on limiting sodium intake.   Intervention  Daily Weights  Sodium Intake < 2000 mg/day  Staff/Patient discussion  Comments: pt does not report any CHF symptoms. EF within normal range  Education  Anatomy and Physiology of the Heart and Cardiac Procedures   Date: 07/02/22 and Daily Weighing   Date: 07/09/22  Pt. Response: -Verbalized Understanding  Does patient have CHF? No, patient does not have CHF.       Medications     Assessment  Patient is currently taking cardiac medications: Yes    Plan (Goals, Intervention, Education)    Goals  -Compliance = 80%    Progress towards Medication Goals: pt remains compliant with cardiac medications.  No changes noted. No questions. Pt encouraged to carry his rescue inhaler with him.     Intervention   Medication Review completed  Staff/Patient Discussion    Comments: n/a    Education   Medication Review  Date: 07/09/22     Pt. Response: -Verbalized Understanding      Additional Comments:  Pt has completed a total of 10 sessions of Outpatient Cardiac Rehab thus far.  Pt has been progressing well thus far, and is now up to a 4.2 MET level.  Pt reports exercising and returning to most ADLs at home.  VS and telemetry have been within normal limits, BP borderline high at times.  Rhythm has shown been stable, and has been transitioned off telemetry monitoring.  Pt has not reported any unusual symptoms. Medications reviewed, no recent changes.  We will plan to progress as tolerated to help the patient continue to work towards meeting their goals.  Will plan to focus on weight lose and reducing his smoking over the next few weeks, as well as increasing MET levels.  All questions answered at this time.

## 2022-07-11 ENCOUNTER — Encounter: Admit: 2022-07-11 | Discharge: 2022-07-11 | Payer: MEDICARE

## 2022-07-11 DIAGNOSIS — I213 ST elevation (STEMI) myocardial infarction of unspecified site: Secondary | ICD-10-CM

## 2022-07-14 ENCOUNTER — Encounter: Admit: 2022-07-14 | Discharge: 2022-07-14 | Payer: MEDICARE

## 2022-07-14 DIAGNOSIS — I213 ST elevation (STEMI) myocardial infarction of unspecified site: Secondary | ICD-10-CM

## 2022-07-16 ENCOUNTER — Encounter: Admit: 2022-07-16 | Discharge: 2022-07-16 | Payer: MEDICARE

## 2022-07-16 DIAGNOSIS — I213 ST elevation (STEMI) myocardial infarction of unspecified site: Secondary | ICD-10-CM

## 2022-07-16 NOTE — Progress Notes
Cardiac Rehab Clinical Nutrition Note    61 y.o. male with past medical history DM2, HTN, HLD, Patient is s/p STEMI and stent on 04/24/22. Started cardiac rehab 06/11/22. At that visit pt reported that he was working on improving his diet over the past 6 weeks. He reports reading labels and limiting sodium, decreasing cholesterol, decreasing trans fats, working on limiting sugar, and eating more whole grains and oatmeal.  He reports that he is having to adjust his tastes, as things are bland without salt. He reports he is down about 15 lbs since prior to his procedure, and that he would like to lose another 15. He does admit to having one cheat meal every week as a reward. Scheduled with RD to further discuss diet and weight loss.    Malnutrition Details:  Does not meet criteria          Lab Draw:  Lab Results   Component Value Date/Time    HGBA1C 5.9 07/03/2021 11:25 AM     Pertinent Meds/Supplements:   ? albuterol sulfate (PROAIR HFA) 90 mcg/actuation HFA aerosol inhaler Inhale 2 puffs by mouth into the lungs every 6 hours as needed for Wheezing or Shortness of Breath. Shake well before use.   ? atorvastatin (LIPITOR) 40 mg tablet Take 40 mg by mouth daily.   ? fluoxetine (PROZAC) 40 mg capsule Take 40 mg by mouth daily.   ? gabapentin (NEURONTIN) 400 mg capsule Take 400 mg by mouth three times daily.   ? ibuprofen (MOTRIN) 800 mg tablet Take 800 mg by mouth three times daily.   ? lisinopril (ZESTRIL) 10 mg tablet Take 10 mg by mouth daily.   ? meloxicam (MOBIC) 7.5 mg tablet Take 7.5 mg by mouth three times daily.   ? omeprazole DR (PRILOSEC) 40 mg capsule Take 40 mg by mouth daily.   ? SYMBICORT 160-4.5 mcg/actuation aerosol inhaler daily.   ? tamsulosin (FLOMAX) 0.4 mg capsule Take 0.4 mg by mouth daily.   ? tiZANidine (ZANAFLEX) 2 mg tablet Take 2 mg by mouth three times daily.     Pertinent Food Allergies/Intolerances:    Wt Readings from Last 10 Encounters:   07/03/21 79.2 kg (174 lb 11.2 oz)       Food recall:  ? Breakfast:  Oatmeal vs raisin bran with medications, water  ? Lunch: Substituted salt with sugar; reported blood sugars starting to run high; previously eating brownies cookies but before that was eating potato chips, now eating unsalted peanuts. Sandwich: turkey/chicken/tuna; sourdough bread  ? Dinner:  Cooking previously, red meat/pork; roommate would make pork [place]; got some veggie burgers  ? Snacks:  No AM snacks, not currently r/t weight loss  ? Beverages:  (goes through 2x/week= 1 gallon of sun tea + 2 Tbsp sugar), occasional (2x/week) 1 beer at night with sandwich or dinner (previously would drink 3-4 per night), limeade (1 glass/day, 16 oz; simply limeade).   ? Fruits: didn't have them for years, started eating again recently  ? Vegetables: tomatoes eating 1 slicer/day; vegetables 1/day with dinner or some lettuce/tomato/onion on sandwich  ? Reports craving: potato chips/pretzels; pt unsure if it's salt or crunch.   ? Pt's roommates cooking majority of time, pt cooking about 2/x week; dining out occasional    Aiming to get down to 165lb; currently stuck at 180lb. Wants to preserve lean muscle mass    FSBS: not checking currently    Comment:  No n/v. Previously had IBS symptoms requiring daily medication;  now improved to about 1/week.     Occasional issues with swallowing pills r/t to screws in neck; occasional issues with bread; will drink additional fluids with bread.     Physical activity: walk dog, yard work, Cardiac rehab and then doing silver sneakers with Y, 1 hr 2x/week (20 minutes treadmill, 30 minutes moving around in pool).     Intervention/Plan:  ? Provided heart healthy nutrition education. Discussed indications for diet, recent lab results and goals, replacing saturated and trans fats with unsaturated fats, limiting saturated fat intake to 5-6% of total caloric intake and avoiding trans fat, omega-3 fats, fiber-rich foods, soluble fiber, fiber goal of 25-35 g daily, limiting added sugar intake to no more than 6% of total caloric intake, sodium limit of 2,000 mg daily, sodium-free seasonings, choosing low-fat dairy, consuming beverages without added sugar, how to read food labels, tips for eating out, food preparation suggestions, recipe ideas, and meal planning tips and resources.  ? Handout provided: Heart Healthy Nutrition Therapy.     The patient was allowed to ask questions and actively participated in creating plan of care. I provided the patient with my contact information and instructed pt on how to get in touch with me if questions or concerns arise. Thank you for allowing nutrition services to participate in this patients care.     Time spent in consultation: 45 minutes    Margarita Grizzle, MS, RD, LD, CCTD   Desk phone [phone] - Available on [facility]

## 2022-07-18 ENCOUNTER — Encounter: Admit: 2022-07-18 | Discharge: 2022-07-18 | Payer: MEDICARE

## 2022-07-18 DIAGNOSIS — I213 ST elevation (STEMI) myocardial infarction of unspecified site: Secondary | ICD-10-CM

## 2022-07-21 ENCOUNTER — Encounter: Admit: 2022-07-21 | Discharge: 2022-07-21 | Payer: MEDICARE

## 2022-07-21 DIAGNOSIS — I213 ST elevation (STEMI) myocardial infarction of unspecified site: Secondary | ICD-10-CM

## 2022-07-23 ENCOUNTER — Encounter: Admit: 2022-07-23 | Discharge: 2022-07-23 | Payer: MEDICARE

## 2022-07-23 DIAGNOSIS — I213 ST elevation (STEMI) myocardial infarction of unspecified site: Secondary | ICD-10-CM

## 2022-07-25 ENCOUNTER — Encounter: Admit: 2022-07-25 | Discharge: 2022-07-25 | Payer: MEDICARE

## 2022-07-25 DIAGNOSIS — I213 ST elevation (STEMI) myocardial infarction of unspecified site: Secondary | ICD-10-CM

## 2022-07-30 ENCOUNTER — Encounter: Admit: 2022-07-30 | Discharge: 2022-07-30 | Payer: MEDICARE

## 2022-07-30 DIAGNOSIS — I213 ST elevation (STEMI) myocardial infarction of unspecified site: Secondary | ICD-10-CM

## 2022-08-01 ENCOUNTER — Encounter: Admit: 2022-08-01 | Discharge: 2022-08-01 | Payer: MEDICARE

## 2022-08-01 DIAGNOSIS — I213 ST elevation (STEMI) myocardial infarction of unspecified site: Secondary | ICD-10-CM

## 2022-08-04 ENCOUNTER — Encounter: Admit: 2022-08-04 | Discharge: 2022-08-04 | Payer: MEDICARE

## 2022-08-04 DIAGNOSIS — I213 ST elevation (STEMI) myocardial infarction of unspecified site: Secondary | ICD-10-CM

## 2022-08-04 NOTE — Progress Notes
Name: Chad Hawkins           MRN: [id number]                 DOB: 1961/03/04          Age: 61 y.o.    CARDIAC REHAB INDIVIDUALIZED TREATMENT PLAN - Reassessment:  60 Day    ITP Date: 08/04/22    Diagnosis:  STEMI      Physician: Jamison Oka    Sessions Completed: 20    Exercise     Assessment  Home Freq: 4 days/wk     Home Dur: 30 min/day    Total Exercise: 120 minutes/week    1st Exercise Day: 06/10/22  3rd Day METS: 2.6  Previous ITP METS: 4.6     Plan (Goals, Intervention, Education)    Goals    -Attend CR Regularly (2-3x/wk)  -Exercise at least 150 min/wk  -Exercise at home 2-3 days/wk    Progress towards Exercise Goals: Pt reports exercising almost everyday he is not in Cardiac rehab. He walks his dog as well as utilizing free weights at home on his free time.     Intervention                Exercise Prescription:  Mode/Duration/Intensity:   Treadmill: 15 minutes/bout, 2.6 mph @ 0% grade  Bike: 15 minutes/bout, 45 average watts  Stepper:  15 minutes/bout, 64 average watts  UBE: 15 minutes/bout, 35 average watts    Freq (d/week):  3     HR Range: Rest + 20-30 bpm  UBR: None  Current METs: 4.6    Progression:  As tolerated per THR/RPE/symptoms. and Increase Watts and METs by 10-20% per week as tolerated.     Comments: Pt has been progressing well. Will continue to increase  Intensity.     Education   Resistance Exercise Guidelines and Stretching   Date: 07/30/22, Aerobic Exercise Guidelines, Warm-up, Cool-down   Date: 07/30/22 and Mets, Physical Activity vs. Exercise, Weather Considerations   Date: 06/11/22     Pt. Response:  -Verbalized Understanding    Nutrition    Assessment  Ht:   Ht Readings from Last 1 Encounters:   No data found for Ht       Weight: 180 lbs     BMI: 28.18     Overweight (25-29.9)     Plan (Goals, Intervention, Education)  Goals   -BMI <25  -Improvement in Lipid Panel   -Waist <40 men, <35 women    Progress towards Dietary Goals: Pt's weight has been stable within his CR sessions. He had met with the RD and stated that it has helped him with portion control as well as label reading. Pt follows a low sodium diet and cooks at home most days.      Intervention   Staff/Patient Discussion    Comments: Pt has been improving his eating habits.    Education   Cholesterol and Dietary Fats   Date: 07/16/22    Pt. response: -Verbalized Understanding    Psychosocial Assessment    Assessment     Shows no s/s       Plan (Goals, Intervention, Education)  Goals   -Absence of Depression  -Reduce PHQ-9 if applicable  -Sleep Management techniques  -Stress Management techniques     Progress towards Psychosocial Goals: Pt reports having a good social support system including his family and friends.     Intervention   Staff/Pt Discussion    Comments:  n/a    Education   Anxiety, Depression, Intimacy   Date: 06/25/22 and Stress and Time Management   Date: 06/18/22     Pt. Response: -Verbalized Understanding    BP Control (Other Core Component)    Assessment  BP: 138/66      History of Hypertension: Yes     Self-monitoring blood pressure at home:  Yes     Plan (Goals, Intervention, Education)  Goals   -Blood Pressure <130/80  -Medication compliance  -Monitoring regularly  -Sodium <2,000 mg/day    Progress towards Blood Pressure Goals: Pt reports monitoring his BP at home daily. Pt follows a low sodium diet of 000mg . BP has been WNL during his CR sessions and stated they have been at home as well. Pt is also compliant with medications.     Intervention   Staff/Patient Discussion    Comments: n/a    Education   None    Pt. response: -N/A    Diabetes (Other Core Component)    Assessment     FBG:   Lab Results   Component Value Date    GLU 90 07/03/2021        Does patient have Diabetes?   No, patient does not have Diabetes.     Tobacco Cessation (Other Core Component)      Assessment      Current Smoking  PPD: 5 cig/day      Does patient use Tobacco?   Plan(Goals,Intervention, Education)   Goals  -Complete cessation  -Quit smoking by the end of the program.  Progress towards Tobacco Goals: Pt repoirts still smoking 5 cig/day but was encouraged to use nicotine pathes and gradually trying to quit. Pt was educated on the benefits of doing so.  Intervention  -Alternatives (gum, patch, medication, etc)  -Staff/Patient discussion  Comments: n/a  Education  None  Pt Response: -N/A    Prevention/Management of CHF Exacerbations (Other Core Component)    Assessment  Denies any current symptoms    Does patient have CHF?  No, patient does not have CHF.       Medications     Assessment  Patient is currently taking cardiac medications: Yes    Plan (Goals, Intervention, Education)    Goals  -Compliance = 80%    Progress towards Medication Goals: Pt reports being compliant with medications as prescribed. No further changes at this time.     Intervention   Medication Review completed  Staff/Patient Discussion    Comments: n/a    Education   Medication Review  Date: 08/04/22     Pt. Response: -Verbalized Understanding      Additional Comments:                                                                       Pt has completed a total of 20 sessions of Outpatient Cardiac Rehab thus far.  Pt has been progressing well thus far.  VS and telemetry have been within normal limits.Pt has not reported any unusual symptoms.  Medications reviewed.  We will plan to progress as tolerated to help the patient continue to work towards meeting their goals.  Will plan to focus on increasing intensity over the next few weeks. All questions  answered at this time.

## 2022-08-06 ENCOUNTER — Encounter: Admit: 2022-08-06 | Discharge: 2022-08-06 | Payer: MEDICARE

## 2022-08-06 DIAGNOSIS — I213 ST elevation (STEMI) myocardial infarction of unspecified site: Secondary | ICD-10-CM

## 2022-08-20 ENCOUNTER — Encounter: Admit: 2022-08-20 | Discharge: 2022-08-20 | Payer: MEDICARE

## 2022-08-20 NOTE — Telephone Encounter
Family member called on 10/16 about patient being admitted to the hospital due to a fall. We were informed that patient will be out for the next few sessions. Will touch base with the patient within the next couple weeks to determine his return date for Cardiac Rehab.

## 2022-08-25 ENCOUNTER — Encounter: Admit: 2022-08-25 | Discharge: 2022-08-25 | Payer: MEDICARE

## 2022-08-25 NOTE — Telephone Encounter
Pt called to update [place] on his schedule for outpatient cardiac rehab. He has broken his wrist, still in hospital in [place]. Patient is going to be put on hold until late Nov due to soreness, broken wrist from fall.

## 2022-09-01 ENCOUNTER — Encounter: Admit: 2022-09-01 | Discharge: 2022-09-01 | Payer: MEDICARE

## 2022-09-17 ENCOUNTER — Encounter: Admit: 2022-09-17 | Discharge: 2022-09-17 | Payer: MEDICARE

## 2022-09-29 ENCOUNTER — Encounter: Admit: 2022-09-29 | Discharge: 2022-09-29 | Payer: MEDICARE

## 2022-09-29 NOTE — Progress Notes
Name: Chad Hawkins           MRN: [id number]                 DOB: Dec 15, 1960          Age: 61 y.o.    CARDIAC REHAB INDIVIDUALIZED TREATMENT PLAN - Reassessment:  120 Day    ITP Date: 09/29/2022    Diagnosis:  STEMI      Physician: Jamison Oka    Sessions Completed: 21    Pt is currently on hold due to sleep walking, resulting in falling down the stairs and breaking his wrist and fracturing some ribs. Pt will be placed on hold up to 2 months. He has been in contact with staff, communicating current health status. He is undergoing other medical test to determine plan of care.   09/29/22: Left voicemail with Chad Hawkins to check on his recovery. Will await call back for an update.     Exercise     Assessment  Home Freq: Unable to assess days/wk     Home Dur: N/a min/day    Total Exercise: N/a minutes/week    1st Exercise Day: 06/10/22  3rd Day METS: 2.6  Not attending regularly, Pt has a recent fall resulting in a broken wrist and fractured ribs. Pt ongoing other medical test to determine if there is an underlying condition to his sleep walking that resulted in his fall. 09/29/22: Chad Hawkins continues to be on medical hold and has not attended a session in the last 30 days.       Plan (Goals, Intervention, Education)    Goals    -Attend CR Regularly (2-3x/wk)  -Exercise at least 150 min/wk  -Exercise at home 2-3 days/wk    Progress towards Exercise Goals: Pt has not attended sessions in the last 30 days, therefore has made no exercise progression. He was doing well prior to his hold of CR. Pt started at 2.9 MET's and had worked up to 5.1 MET's. He was coming consistently.   09/29/22: Unable to assess exercise goals with patient's absence.     Intervention                Exercise Prescription:  Mode/Duration/Intensity:   Treadmill: 15 minutes/bout, 2.6 mph @ 0% grade  Bike: 15 minutes/bout, 50 average watts  Stepper:  15 minutes/bout, 76 average watts  UBE: 15 minutes/bout, 35 average watts    Freq (d/week):  3     HR Range: Rest + 20-30 bpm  UBR: None  Current METs: 3.0-5.1    Progression:  As tolerated per THR/RPE/symptoms., Increase time on all modalities to 15 minutes each, then increase intensity by 10-20% per week.  and Increase Watts and METs by 10-20% per week as tolerated.     Comments: Will remain in contact with pt with the hope he can return to rehab in the near future.     Education   Exercise Guidelines and Recommendations   Date: 8/8, Warm up, Cool down, Stretching   Date: 9/27 and Mets, Physical Activity vs. Exercise, Weather Considerations, Intimacy   Date: 8/9     Pt. Response:  -Verbalized Understanding    Nutrition    Assessment  Ht:   Ht Readings from Last 1 Encounters:   No data found for Ht       Weight: 180 lbs (08/06/22)    BMI: 28.18     Overweight (25-29.9)       Plan (Goals, Intervention, Education)  Goals   -  BMI <25  -Compliant with cholesterol medication  -Improvement in Lipid Panel   -Waist <40 men, <35 women    Progress towards Dietary Goals: Pt's weight has remained stables since starting cardiac rehab. He met with the dietician on 9/13.   09/29/22: Unable to assess patient's progression in dietary goals due to patient's absence.     Intervention   RD appointment, 9/13  Staff/Patient Discussion    Comments: N/a    Education   Cholesterol and Dietary Fats   Date: 9/1    Pt. response: -Verbalized Understanding    Psychosocial Assessment    Assessment     Shows no s/s       Plan (Goals, Intervention, Education)  Goals   -Absence of Depression  -Reduce PHQ-9 if applicable  -Sleep Management techniques  -Stress Management techniques     Progress towards Psychosocial Goals: Unable to assess. Pt has voiced no concerns in past sessions.   09/29/22: Unable to assess goals due to patient's absence.     Intervention   Identify Stressors    Comments: Pt has reported strong support system at home among family and friends.     Education   Stress Management   Date: 8/16, [facility]  Date: 8/8 and Cardiac Support Group and Emotional Support options   Date: 8/8     Pt. Response: -Verbalized Understanding    BP Control (Other Core Component)    Assessment  BP: 118/64 (09/29/22)      History of Hypertension: Yes     Self-monitoring blood pressure at home:  Yes     Plan (Goals, Intervention, Education)  Goals   -Blood Pressure <130/80  -Medication compliance  -Monitoring regularly  -Sodium <2,000 mg/day    Progress towards Blood Pressure Goals: Pt's blood pressure has been WNL at home and at rehab.  09/29/22: Unable to assess due to patient's absence.      Intervention   N/A    Comments: Pt was monitoring BP at home daily and taking medications as prescribed.     Education   None    Pt. response: -N/A    Diabetes (Other Core Component)    Assessment     FBG:   Lab Results   Component Value Date    GLU 90 07/03/2021        Does patient have Diabetes?   No, patient does not have Diabetes.     Tobacco Cessation (Other Core Component)      Assessment      Current Smoking  PPD: ~5 cigs      Does patient use Tobacco?   Yes  Plan(Goals,Intervention, Education)   Goals  -Complete cessation  -Quit vaping by the end of the program.  Progress towards Tobacco Goals: Unable to assess due to pt not being present since 10/4  Intervention  -[employer] information given  Comments: 8/8  Education  -Risk Factors for Heart Disease and Stroke   Date: 8/8 and -[place] Quit Line resources   Date: 8/8  Pt Response: -Verbalized Understanding    Prevention/Management of CHF Exacerbations (Other Core Component)    Assessment  Current Symptoms: Unable to assess    Does patient have CHF?  No, patient does not have CHF.       Medications     Assessment  Patient is currently taking cardiac medications: Yes    Plan (Goals, Intervention, Education)    Goals  -Compliance = 80%    Progress towards Medication  Goals: Will perform medication review when pt returns.     Intervention   N/A    Comments: Pt was taking medications in past sessions. Education   None     Pt. Response: -N/A      Additional Comments:  Pt has made no progression in the last 30 days due to medical hold. Pt has a recent fall that resulted in a broken wrist and fractured ribs. Pt ongoing medical test as he was sleep walking, resulting in his fall. Pt has been in contact with staff. We will keep pt on hold up to 2 months.

## 2022-10-09 ENCOUNTER — Encounter: Admit: 2022-10-09 | Discharge: 2022-10-09 | Payer: MEDICARE

## 2022-10-10 ENCOUNTER — Encounter: Admit: 2022-10-10 | Discharge: 2022-10-10 | Payer: MEDICARE

## 2022-10-10 NOTE — Progress Notes
Name: Chad Hawkins           MRN: [id number]                 DOB: [birth year]          Age: 61 y.o.  Cardiac Rehab INDIVIDUALIZED TREATMENT PLAN - Discharge Assessment- DID NOT COMPLETE    ITP Date: 10/10/2022 Diagnosis: STEMI Physician: Jamison Oka    Start Date/First exercise session: 06/10/2022    Discharge Date: 10/10/2022    Last Session Completed: 08/06/2022    Total Sessions Completed: 21    Reason for not completing/attending program: Health issues    Exercise:   - First day MET: 2.9   - Third day MET: 2.6   - Max MET achieved: 5.1   - Exercise plan: Unable to assess   - Goals AVW:UJWJXB to Assess     - Comments/progress made: Pt improved max MET level from a 2.9 to a 5.1 during time in CR. Other progress is unable to be assessed d/t pt not being present. Pt was placed on medical hold for the last 60 days d/t sleep walking and recovering from a broken wrist and fractured ribs.    Nutrition:   - Initial Weight: 181.3   - Discharge/Last session weight: 180   - RD visit: Yes    - Date:  07/16/22   - Goals Met: Unable to Assess     - Comments/progress made: Pt's weight remained stable throughout program.    Psychosocial:   - Goals Met:Yes     - Comments/progress made: Pt stated in previous appts that he had no s/s of depression or anxiety.    Blood pressure:   - History of Htn:Yes    - Final Day Blood Pressure: 124/70   - BP within normal limits: Yes    - Goals Met: Yes     - Comments/progress made:  Pt's BP levels remained WNL and pt reported compliance with BP medications.    Diabetes:   - Patient is Diabetic: No   - Goals Met:N/A     - Comments/progress made:  Pt is not diabetic.    Tobacco usage:   - Tobacco status: Current Smoking   - Goals Met:No   - Cessation counseling or resources given: Yes     - Comments/progress made:  Pt has a goal of complete cessation. At last session review , pt stated he is still smoking. Current progress unable to be assessed d/t pt being absent.      Prevention/Management of CHF Exacerbations:   - Patient has a history of CHF: No   - Current/ongoing symptoms reported: Unable to Assess   - Goals Met: N/A     - Comments/progress made:  Pt does not have CHF.    Medications:   - Medication Review completed:No  - Patient compliant with medications:  In past sessions, pt has reported medication compliance, but current compliance was unable to be assessed.  - Goals Met: Unable to Assess    - Comments/progress made: Unable to Assess    Education Topics Completed:   -Home Exercise Guidelines  Date: 06/10/22, Resistance Exercise Guidelines and Stretching  Date: 07/30/22, Aerobic Exercise Guidelines, Warm-up and Cool-down  Date: 07/30/22, METs, Physical Activity vs. Exercise, and Weather Considerations  Date: 06/11/22, Cholesterol and Dietary Fats  Date: 07/04/22*, Smoking and Tobacco Cessation  Date: 06/10/22, Cardiovascular Risk Factors and Risk Factor Modification  Date: 06/10/22, and Angina/Nitro  Date: 06/10/22

## 2022-10-22 ENCOUNTER — Encounter: Admit: 2022-10-22 | Discharge: 2022-10-22 | Payer: MEDICARE

## 2023-03-16 ENCOUNTER — Encounter: Admit: 2023-03-16 | Discharge: 2023-03-16 | Payer: MEDICARE

## 2024-01-26 ENCOUNTER — Encounter: Admit: 2024-01-26 | Discharge: 2024-01-26 | Payer: MEDICARE

## 2024-02-09 ENCOUNTER — Encounter: Admit: 2024-02-09 | Discharge: 2024-02-09

## 2024-02-09 NOTE — Telephone Encounter
 02-09-2024 Per Task Message, request faxed to [facility], requested Images, (F) [phone], reports are in Care Everywhere, clp    [facility]   [phone] ? [phone]

## 2024-02-09 NOTE — Progress Notes
 CARDIAC NEW PATIENT PROFILE      PHYSICIANS INFORMATION:   REFERRING PHYSICIAN: Christin Coy, APRN     PCP: Christin Coy, APRN         REASON FOR VISIT/DIAGNOSIS: Establish care for CAD    RECENT EVENTS/SYMPTOMS:     07/03/23 [facility]   Reason for Appointment:  Follow-up     HISTORY OF PRESENT ILLNESS:  63 year old male with history of CAD s/p PCI/DES to RCA in June 2023 and subdural hematoma s/p fall presented today for a follow-up visit.  He stated that he has been feeling well and denied any chest pain, orthopnea/PND, palpitations or syncope.  He denied any claudication symptoms as well.  He is presently trying to quit smoking.    ASSESSMENT AND PLAN:   1. Coronary artery disease involving native coronary artery of native heart, unspecified whether angina present  S/p PCI/DES to RCA, presently stable and chest pain free.  2D echo showed normal LV systolic function with diastolic dysfunction and Lexiscan nuclear stress test did not show any significant ischemia.  Stop Brilinta and continue aspirin.  Continue current secondary prevention measures.     2. VT (ventricular tachycardia) (CMS/HCC)  Ischemic in the setting of acute MI, without any further episodes.     3. Essential hypertension  Blood pressure better controlled.  Continue current medical regimen.     4. Hyperlipidemia, unspecified hyperlipidemia type  Continue statin therapy.     5. Type 2 diabetes mellitus without complication, without long-term current use of insulin (CMS/HCC)  Continue current treatment per PCP.       6. Tobacco abuse  Advised on smoking cessation.     7.  Subdural hematoma  Being followed by neurosurgery team.    PERTINENT CARDIAC HISTORY: CAD s/p PCI/DES to RCA, HTN, HLD, VT, STEMI    OTHER MEDICAL HISTORY:  subdural hematoma, DM, tobacco use, COPD    MOST RECENT PERTINENT TESTING/PROCEDURES:     07/01/23 [facility]  Conclusion   1. No evidence of EKG changes with stress testing.   2. Normal perfusion at stress/rest.   3. Low risk study.   4. EF > 60%.     07/01/23 Echo [facility]  Conclusion   The left ventricular systolic function is normal and the ejection fraction is within normal range. EF 55%   There is normal LV segmental wall motion.     04/25/22 ECHO [facility]  The left ventricle is normal size.   The systolic function is overall intact.  LV ejection fraction of 50 to 55%.   There is minimal inferior wall hypokinesis.   Doppler and Color Flow revealed no significant aortic regurgitation.   There is no significant aortic valvular stenosis.   Doppler and Color-flow revealed trace mitral regurgitation.   Doppler and Color Flow revealed trace tricuspid regurgitation.     04/24/22 Cardiac Cath [facility]  1.  Severe single-vessel coronary artery disease, 100% occlusion of   proximal segment of dominant right coronary artery   2.  Successful PCI/drug-eluting stent placement to right coronary artery   3.  Successful cardioversion for ischemic ventricular tachycardia   4.  Cardiogenic shock necessitating pressor support     RECOMMENDATIONS   1. Aspirin 81 mg daily   2.  Ticagrelor 90 mg twice daily   3.  Check 2D echo to assess LV systolic function   4.  Monitor closely in ICU for any further arrhythmias   5.  Wean dopamine off as tolerated  6.  Cardiovascular risk factor modification including smoking cessation   7.  Cardiac rehabilitation referral upon discharge     IMAGES:    04/24/22 ECG

## 2024-02-16 ENCOUNTER — Ambulatory Visit: Admit: 2024-02-16 | Discharge: 2024-02-17 | Payer: MEDICARE

## 2024-02-16 ENCOUNTER — Encounter: Admit: 2024-02-16 | Discharge: 2024-02-16 | Payer: MEDICARE

## 2024-03-09 ENCOUNTER — Encounter: Admit: 2024-03-09 | Discharge: 2024-03-09 | Payer: MEDICARE

## 2024-03-09 ENCOUNTER — Ambulatory Visit: Admit: 2024-03-09 | Discharge: 2024-03-09 | Payer: MEDICARE

## 2024-03-09 NOTE — Telephone Encounter
 Pt here for his Echo and had a blood pressure check while here for that appointment. His blood pressure today was 138/84. I will let Dr. Katheran Palms know.

## 2024-03-14 ENCOUNTER — Encounter: Admit: 2024-03-14 | Discharge: 2024-03-14 | Payer: MEDICARE

## 2024-03-14 NOTE — Telephone Encounter
 I called pt and left pt a message with results below as we have permission to.       Sandy Crumb, MD to [facility] Nurse Gen Card Team [place] (Selected Message)      03/14/24 11:25 AM  Result Note  Echocardiogram reveals normal left ventricular function.  There is no evidence of any hemodynamically significant valvular heart disease.  2D + DOPPLER ECHO

## 2024-04-12 ENCOUNTER — Encounter: Admit: 2024-04-12 | Discharge: 2024-04-12 | Payer: MEDICARE

## 2024-04-12 NOTE — Telephone Encounter
 04/12/2024 11:02 AM   LVM for pt. Asking for an update on labs that were requested.     Gutwein, Mackenzie, RN  P [facility] Nurse Gen Card Team [facility]         Previous Messages       ----- Message -----  From: Buell Carmin, RN  Sent: 04/05/2024  12:00 AM CDT  To: Buell Carmin, RN  Subject: Labs completed?                                  FLP/BMP to be drawn in 6 weeks. Did patient complete?  Meds changes were made 4/15 at [facility].

## 2024-04-22 ENCOUNTER — Encounter: Admit: 2024-04-22 | Discharge: 2024-04-22 | Payer: MEDICARE

## 2024-05-17 ENCOUNTER — Encounter: Admit: 2024-05-17 | Discharge: 2024-05-17 | Payer: MEDICARE

## 2024-05-17 NOTE — Telephone Encounter
 LVM, reviewed result note per Dr. Albina.

## 2024-05-17 NOTE — Telephone Encounter
-----   Message from DELENA Queen, MD sent at 05/17/2024  1:34 PM CDT -----  Labs are good.  Would recommend to continue present medications.  In addition to that healthy diet and exercise.  ----- Message -----  From: Donell Raisin, BSN  Sent: 04/26/2024   1:59 PM CDT  To: Alben Queen, MD      ----- Message -----  From: Othmer, Katelyn, RN  Sent: 04/26/2024   1:33 PM CDT  To: [facility] Nurse [facility] Card Team [place]      ----- Message -----  From: Rebecka, In Reflab  Sent: 04/26/2024  12:43 PM CDT  To: [facility] Nurse Lab Results

## 2024-07-19 ENCOUNTER — Encounter: Admit: 2024-07-19 | Discharge: 2024-07-19 | Payer: MEDICARE

## 2024-07-19 DIAGNOSIS — I10 Essential (primary) hypertension: Principal | ICD-10-CM

## 2024-07-19 DIAGNOSIS — I2111 ST elevation (STEMI) myocardial infarction involving right coronary artery: Secondary | ICD-10-CM

## 2024-07-19 DIAGNOSIS — R0602 Shortness of breath: Secondary | ICD-10-CM

## 2024-07-19 MED ORDER — BRILINTA 90 MG PO TAB
90 mg | ORAL_TABLET | Freq: Two times a day (BID) | ORAL | 0 refills | 30.00000 days | Status: AC
Start: 2024-07-19 — End: ?

## 2024-07-19 NOTE — Telephone Encounter
 07/19/2024 12:04 PM   Refilled Brillinta for 90 days. Mailed pt lab letter and CBC order. Pt. Up to date on [facility].     Med Refill  Received: Today  Marsa Ricka SHAUNNA Sanna Nurse Gen Card Team [facility]  Patient states he is out of his Brilinta  and his pharmacy has been saying that have been trying to contact our office but I explained that there are no notations of that.  He would like to get this script sent to the [facility] [phone] and their fax number is, [phone].    Thank you

## 2024-07-20 ENCOUNTER — Encounter: Admit: 2024-07-20 | Discharge: 2024-07-20 | Payer: MEDICARE

## 2024-07-20 NOTE — Telephone Encounter
 LM on nurse line he rec'd message re: lab work.  Ret'd call, informed him refills for Brilinta  was sent to pharmacy, though needs non-fasting lab work done and requisition was mailed. After received can take paper to any lab to have collected, questions if needs to wait until after lab done to begin medication, informed to continue taking when picks up from pharmacy. Reports had difficulty getting refills d/t retirement of one pharmacist and another on maternity leave, has been off Brilinta  going on 3 wks. Informed definitely needs to begin ASAP and if has issues again to contact office right away as should not go without or miss taking medication. verbalized understanding & agrees with treatment plan.

## 2024-08-09 ENCOUNTER — Encounter: Admit: 2024-08-09 | Discharge: 2024-08-09 | Payer: MEDICARE

## 2024-08-09 DIAGNOSIS — I2111 ST elevation (STEMI) myocardial infarction involving right coronary artery: Secondary | ICD-10-CM

## 2024-08-09 DIAGNOSIS — I10 Essential (primary) hypertension: Principal | ICD-10-CM

## 2024-08-09 DIAGNOSIS — R0602 Shortness of breath: Secondary | ICD-10-CM

## 2024-08-09 LAB — CBC
HEMATOCRIT: 42
HEMOGLOBIN: 14
MCH: 33 — AB
MCHC: 33
MCV: 101 — AB
PLATELET COUNT: 186
RBC COUNT: 4.1
RDW: 13
WBC COUNT: 5.8

## 2024-08-10 ENCOUNTER — Encounter: Admit: 2024-08-10 | Discharge: 2024-08-10 | Payer: MEDICARE

## 2024-08-10 NOTE — Telephone Encounter
LVM with pt regarding lab results.

## 2024-08-25 ENCOUNTER — Encounter: Admit: 2024-08-25 | Discharge: 2024-08-25 | Payer: MEDICARE

## 2024-08-25 ENCOUNTER — Ambulatory Visit: Admit: 2024-08-25 | Discharge: 2024-08-26 | Payer: MEDICARE

## 2024-08-25 VITALS — BP 147/83 | HR 78 | Ht 67.0 in | Wt 183.0 lb

## 2024-08-25 DIAGNOSIS — I2111 ST elevation (STEMI) myocardial infarction involving right coronary artery: Secondary | ICD-10-CM

## 2024-08-25 DIAGNOSIS — I10 Essential (primary) hypertension: Secondary | ICD-10-CM

## 2024-08-25 DIAGNOSIS — J449 Chronic obstructive pulmonary disease, unspecified: Secondary | ICD-10-CM

## 2024-08-25 DIAGNOSIS — I2510 Atherosclerotic heart disease of native coronary artery without angina pectoris: Principal | ICD-10-CM

## 2024-08-25 DIAGNOSIS — E785 Hyperlipidemia, unspecified: Secondary | ICD-10-CM

## 2024-08-25 MED ORDER — LOSARTAN 100 MG PO TAB
100 mg | ORAL_TABLET | Freq: Every day | ORAL | 3 refills | 90.00000 days | Status: AC
Start: 2024-08-25 — End: ?

## 2024-08-25 NOTE — Patient Instructions
 Thank you for visiting our office today, we want to provide you with the best possible care.     Dr Albina would like to see you again in about 6 months.     Please complete the following orders:     STOP taking Brilinta     INCREASE Losartan to 100mg  daily.       Smoking cessation, info below    Take your medications as prescribed.   Please carefully check your list with what you have on hand at home and notify us  of any changes or updates.     Please call or mychart message with any additional questions or concerns.    For questions: please Dr. Lunette nursing line at [phone] Monday - Friday 8-5 only. [address]/[place])        Please leave a detailed message with your name, date of birth, and reason for your call.  A nurse will return your call as soon as possible    Scheduling: Please call [phone]    After hours: [phone]    For all medication refills: please contact your pharmacy.      It was truly our pleasure seeing you today. Thank you for choosing [facility] for your heart health!     Resources for Patients to Quit Smoking    If you live outside of [place]  or [place]     National Quit line: 1-800-QUIT-NOW 838-138-1257) This line will route you to your state sponsored quit line based on your calling area.  The website for the national quit line is: www.smokefree.gov    For [place]  residents:     [place] [employer] KanQuit program  (463)165-9471 or (559) 210-8006)  ConnectRV.is.html  Free program 24hrs per day 7 days per week. English, [place], & 150 other languages.  [place] Quit line: 1-866-KAN-STOP (8-133-473-2132)  http://www.kstask.org/quitline.html  Free program available 24 hrs per day 7 days per week. Information is available via phone or online. Phone assessment by trained counselor to help identify triggers and set goals, smoker can call as many times as desired.    For [place]  residents  [place] quit line website:  ThisMLS.nl    link directly to the [place] quit line patient brochure:  TVFolio.ch.pdf

## 2024-08-25 NOTE — Progress Notes
 Date of Service: 08/25/2024    Chad Hawkins is a 63 y.o. male.       HPI            Dear Chad Hawkins:     I had the pleasure of seeing Mr. Chad Hawkins in my office today for his cardiovascular follow-up.    Chad Hawkins is a very pleasant 63 year old gentleman, who had an acute ST elevation inferior wall myocardial infarction in June of 2023.  The patient was taken to the cardiac catheterization laboratory.  Left heart cardiac catheterization reveals severe single-vessel coronary artery disease with 100% occlusion of the proximal segment of the right coronary artery.  The left coronary system and left main appeared to show no significant disease according to the cardiac catheterization report.  He had a successful PCI with a drug-eluting stent placement in the right coronary artery.  During the procedure, the patient did have an episode of ventricular tachycardia, which required to be cardioverted.  His echocardiogram following the acute myocardial infarction revealed a low-normal left ventricular function with mild inferior wall hypokinesis.  He then did have repeat echocardiogram, which revealed once again low-normal ejection fraction of around 55% and a stress test in August of last year, which showed no evidence of any significant myocardial ischemia.     The patient retired from [employer].  Unfortunately, still smokes about 5-6 cigarettes a day and has been trying to quit.    Since his last visit he has been doing fairly good.  Still has shortness of breath which appears to be mostly related to his COPD and continued tobacco use.  He denies any chest pains.  No significant claudication symptoms.  Has pain in his knees when he walks.  Has not had any palpitations, dizziness or lightheadedness.    Recent echocardiogram revealed normal left ventricular function.  There was no evidence of any hemodynamically significant valvular heart disease.  His last stress test in August of last year at [facility] was normal with no evidence of any significant ischemia.    His ST elevation myocardial infarction was over 2 years ago.    Recommendations:  1.  Reviewed his medications.  Will discontinue the Brilinta  now.  Is been more than 2 years since his ST elevation myocardial infarction and coronary intervention.  He will continue with his aspirin at 81 mg daily.  2.  Blood pressures are elevated.  Blood pressures remain elevated at home in the morning.  Usually runs around 150s systolic.  I will increase his losartan from 50 mg once a day to 100 mg once a day.  3.  He will continue to monitor his blood pressures at home.  Blood pressure goal systolic less than equal to 230 mmHg and diastolic less than equal to 85 mmHg.  4.  Smoking cessation again discussed with the patient.  Has a hard time cutting back the cigarettes completely.  5.  Follow-up 6 months unless there is any change in symptoms.    Total time spent with Chad Hawkins including reviewing his recent echocardiogram, medication changes and reviewing my recommendations and counseling was 35 minutes in the clinic.      (INR:[id number])             Vitals:    08/25/24 0756   BP: (!) 147/83   BP Source: Arm, Left Upper   Pulse: 78   SpO2: 95%   PainSc: Zero   Weight: 83 kg (183 lb)   Height: 170.2 cm (  5' 7)       Body mass index is 28.66 kg/m?[name]     Past Medical History  Patient Active Problem List    Diagnosis Date Noted    Benign prostatic hyperplasia 02/09/2024    STEMI (ST elevation myocardial infarction) (CMS-HCC) 04/24/2022    Chronic obstructive pulmonary disease (CMS-HCC) 02/11/2022    Hyperlipidemia 02/11/2022    Hypertensive disorder 02/11/2022         Review of Systems   Constitutional: Negative.   HENT: Negative.     Eyes: Negative.    Cardiovascular: Negative.    Respiratory: Negative.     Endocrine: Negative.    Hematologic/Lymphatic: Negative.    Skin: Negative.    Musculoskeletal: Negative.    Gastrointestinal: Negative.    Genitourinary: Negative. Neurological: Negative.    Psychiatric/Behavioral: Negative.     Allergic/Immunologic: Negative.        Physical Exam  General Appearance: resting comfortably, no acute distress  Eyes: conjunctivae and lids normal  Lips & Oral Mucosa: no pallor or cyanosis  Neck Veins: neck veins are not distended  Thyroid: no nodules, masses, tenderness or enlargement  Chest Inspection: chest is normal in appearance  Respiratory Effort: breathing is unlabored, no respiratory distress  Auscultation/Percussion: lungs clear to auscultation, no rales, rhonchi, or wheezing  PMI: PMI not enlarged or displaced  Cardiac Rhythm: regular rhythm   Cardiac Auscultation: Normal S1 & S2, no S3 or S4, no rub  Murmurs: no cardiac murmurs   Carotid Arteries: normal carotid upstroke bilaterally, no bruits  Pedal Pulses: B/L DP 1+ and PT2+  Lower Extremity Edema: no lower extremity edema  Abdominal Exam: soft, non-tender, no masses, bowel sounds normal  Abdominal Aorta: no abdominal bruits  Liver & Spleen: no organomegaly  Neurologic Exam: neurological assessment grossly intact  Orientation: oriented to time, place and person  Affect & Mood: appropriate and sustained affect  Other: moves all extremities      Cardiovascular Studies  03/09/2024 Echo:    The left ventricular size is normal. Wall thickness is increased. Concentric remodeling. The left ventricular systolic function is normal. The ejection fraction by Simpson's biplane method is 62%. There are no segmental wall motion abnormalities.    The right ventricular size is normal. The right ventricular systolic function is normal.    Normal biatrial size. Normal estimated left atrial pressure.    No significant valvular abnormalities.    The pulmonary artery pressure could not be estimated due to inadequate tricuspid regurgitation signal. Normal estimated CVP.    No pericardial effusion.    No prior for comparison.    07/01/23 [facility]  Conclusion   1. No evidence of EKG changes with stress testing.   2. Normal perfusion at stress/rest.   3. Low risk study.   4. EF > 60%.      07/01/23 [facility]  Conclusion   The left ventricular systolic function is normal and the ejection fraction is within normal range. EF 55%   There is normal LV segmental wall motion.      04/25/22 ECHO [facility]  The left ventricle is normal size.   The systolic function is overall intact.  LV ejection fraction of 50 to 55%.   There is minimal inferior wall hypokinesis.   Doppler and Color Flow revealed no significant aortic regurgitation.   There is no significant aortic valvular stenosis.   Doppler and Color-flow revealed trace mitral regurgitation.   Doppler and Color Flow revealed trace tricuspid  regurgitation.      04/24/22 Cardiac Cath [facility]  1.  Severe single-vessel coronary artery disease, 100% occlusion of   proximal segment of dominant right coronary artery   2.  Successful PCI/drug-eluting stent placement to right coronary artery   3.  Successful cardioversion for ischemic ventricular tachycardia   4.  Cardiogenic shock necessitating pressor support  5.  No significant coronary artery disease involving the left main, left anterior sending artery and left circumflex artery.    EKG today shows normal sinus rhythm.  Heart rate 68 bpm.  No significant ST-T changes are noted.  EKG within normal limits.    Cardiovascular Health Factors  Vitals BP Readings from Last 3 Encounters:   08/25/24 (!) 147/83   03/09/24 138/84   02/16/24 (!) 160/92     Wt Readings from Last 3 Encounters:   08/25/24 83 kg (183 lb)   03/09/24 83 kg (183 lb)   02/16/24 83 kg (183 lb)     BMI Readings from Last 3 Encounters:   08/25/24 28.66 kg/m?   03/09/24 28.66 kg/m?   02/16/24 28.66 kg/m?      Smoking Social History     Tobacco Use   Smoking Status Every Day    Types: Cigarettes   Smokeless Tobacco Never   Tobacco Comments    5 cigs daily      Lipid Profile Cholesterol   Date Value Ref Range Status   04/22/2024 127 <200 mg/dL Final     HDL   Date Value Ref Range Status   04/22/2024 50 > OR = 40 mg/dL Final     LDL   Date Value Ref Range Status   04/22/2024 49 mg/dL (calc) Final     Comment:     Reference range: <100     Desirable range <100 mg/dL for primary prevention;    <70 mg/dL for patients with CHD or diabetic patients   with > or = 2 CHD risk factors.     LDL-C is now calculated using the Martin-Hopkins   calculation, which is a validated novel method providing   better accuracy than the Friedewald equation in the   estimation of LDL-C.   Gladis APPLETHWAITE et al. SANDREA. 7986;689(80): 2061-2068   (http://education.[facility].com/faq/FAQ164)       Triglycerides   Date Value Ref Range Status   04/22/2024 212 (H) <150 mg/dL Final     Comment:        If a non-fasting specimen was collected, consider  repeat triglyceride testing on a fasting specimen  if clinically indicated.   Veatrice et al. J. of Clin. Lipidol. 2015;9:129-169.           Blood Sugar Hemoglobin A1C   Date Value Ref Range Status   07/03/2021 5.9 4.0 - 6.0 % Final     Comment:     The ADA recommends that most patients with type 1 and type 2 diabetes maintain   an A1c level <7%.       Glucose   Date Value Ref Range Status   04/22/2024 100 (H) 65 - 99 mg/dL Final     Comment:                   Fasting reference interval     For someone without known diabetes, a glucose value  between 100 and 125 mg/dL is consistent with  prediabetes and should be confirmed with a  follow-up test.        04/22/2024 100 (  H)  Final   01/20/2024 90  Final          Problems Addressed Today  No diagnosis found.      Assessment and Plan     1.  Coronary artery disease status post inferior wall myocardial infarction in June 2023.  Patient had single-vessel coronary artery disease with 100% occlusion of the right coronary artery.  Successful intervention was done.  Patient has been taking aspirin and Brilinta  since 2023.  He has been doing fairly good.  His last stress test in August of last year was normal.  His recent echocardiogram showed normal LV function.  He is having no symptoms of chest pain.  I have discontinued the Brilinta .  Patient is advised to continue with his aspirin.  2.  Hypertension.  Blood pressures are elevated.  Gave him high at home.  Losartan has been increased.  Patient follows his blood pressures at home.  3.  Dyslipidemia.  Good lipid numbers.  4.  COPD.  Unfortunately still smoking.  Advised to continue to work on smoking cessation.         (INR:[id number])             Current Medications (including today's revisions)   albuterol sulfate (PROAIR HFA) 90 mcg/actuation HFA aerosol inhaler Inhale two puffs by mouth into the lungs every 6 hours as needed for Wheezing or Shortness of Breath. Shake well before use.    aspirin EC (ASPIR-LOW) 81 mg tablet Take one tablet by mouth daily.    atorvastatin (LIPITOR) 80 mg tablet Take one tablet by mouth daily.    BRILINTA  90 mg tablet Take one tablet by mouth every 12 hours.    carvediloL (COREG) 12.5 mg tablet Take one tablet by mouth twice daily with meals.    ergocalciferol (vitamin D2) (VITAMIN D PO) Take  by mouth.    finasteride (PROSCAR) 5 mg tablet Take one tablet by mouth daily.    fluoxetine (PROZAC) 40 mg capsule Take one capsule by mouth daily.    fluticasone propionate (FLONASE) 50 mcg/actuation nasal spray, suspension Apply two sprays to each nostril as directed daily.    gabapentin (NEURONTIN) 400 mg capsule Take one capsule by mouth three times daily.    levETIRAcetam (KEPPRA) 250 mg tablet every 12 hours.    loperamide (IMODIUM A-D) 2 mg capsule Take one capsule by mouth four times daily as needed for Diarrhea.    losartan (COZAAR) 50 mg tablet Take one tablet by mouth daily.    mirtazapine (REMERON) 15 mg tablet     omeprazole DR (PRILOSEC) 40 mg capsule Take one capsule by mouth daily.    SYMBICORT 160-4.5 mcg/actuation aerosol inhaler daily.    tamsulosin (FLOMAX) 0.4 mg capsule Take one capsule by mouth daily.    tiZANidine (ZANAFLEX) 2 mg tablet Take one tablet by mouth three times daily.    traMADoL (ULTRAM) 50 mg tablet 1 tablet as needed Orally every 8 hours as needed for 30 days (Patient taking differently: As needed)

## 2024-11-01 ENCOUNTER — Ambulatory Visit: Admit: 2024-11-01 | Discharge: 2024-11-01 | Payer: MEDICARE

## 2024-11-29 ENCOUNTER — Encounter: Admit: 2024-11-29 | Discharge: 2024-11-29 | Payer: MEDICARE
